# Patient Record
Sex: Male | Born: 1960 | Race: Black or African American | Hispanic: No | Marital: Married | Smoking: Never smoker
Health system: Southern US, Community
[De-identification: ages and names within clinical notes are randomized; demographics above are authoritative.]

## PROBLEM LIST (undated history)

## (undated) DIAGNOSIS — I10 Essential (primary) hypertension: Secondary | ICD-10-CM

## (undated) DIAGNOSIS — E7800 Pure hypercholesterolemia, unspecified: Secondary | ICD-10-CM

## (undated) HISTORY — PX: OTHER SURGICAL HISTORY: SHX169

## (undated) HISTORY — PX: CHOLECYSTECTOMY: SHX55

---

## 2002-01-22 ENCOUNTER — Emergency Department (HOSPITAL_COMMUNITY): Admission: EM | Admit: 2002-01-22 | Discharge: 2002-01-22 | Payer: Self-pay | Admitting: Emergency Medicine

## 2003-12-10 ENCOUNTER — Emergency Department (HOSPITAL_COMMUNITY): Admission: EM | Admit: 2003-12-10 | Discharge: 2003-12-11 | Payer: Self-pay | Admitting: *Deleted

## 2005-04-01 ENCOUNTER — Emergency Department (HOSPITAL_COMMUNITY): Admission: EM | Admit: 2005-04-01 | Discharge: 2005-04-01 | Payer: Self-pay | Admitting: Emergency Medicine

## 2006-02-02 ENCOUNTER — Ambulatory Visit: Payer: Self-pay | Admitting: Family Medicine

## 2006-02-08 ENCOUNTER — Encounter (INDEPENDENT_AMBULATORY_CARE_PROVIDER_SITE_OTHER): Payer: Self-pay | Admitting: Family Medicine

## 2006-02-08 LAB — CONVERTED CEMR LAB: RBC count: 5.17 10*6/uL

## 2006-02-26 ENCOUNTER — Encounter (INDEPENDENT_AMBULATORY_CARE_PROVIDER_SITE_OTHER): Payer: Self-pay | Admitting: Family Medicine

## 2006-02-26 LAB — CONVERTED CEMR LAB: PSA: 0.55 ng/mL

## 2006-03-08 ENCOUNTER — Ambulatory Visit: Payer: Self-pay | Admitting: Family Medicine

## 2006-03-12 ENCOUNTER — Encounter: Payer: Self-pay | Admitting: Family Medicine

## 2006-03-12 DIAGNOSIS — R7989 Other specified abnormal findings of blood chemistry: Secondary | ICD-10-CM | POA: Insufficient documentation

## 2006-03-12 DIAGNOSIS — I10 Essential (primary) hypertension: Secondary | ICD-10-CM | POA: Insufficient documentation

## 2006-03-12 DIAGNOSIS — J309 Allergic rhinitis, unspecified: Secondary | ICD-10-CM | POA: Insufficient documentation

## 2006-03-12 DIAGNOSIS — E785 Hyperlipidemia, unspecified: Secondary | ICD-10-CM

## 2006-03-12 DIAGNOSIS — K219 Gastro-esophageal reflux disease without esophagitis: Secondary | ICD-10-CM

## 2006-06-08 ENCOUNTER — Ambulatory Visit: Payer: Self-pay | Admitting: Family Medicine

## 2006-06-08 LAB — CONVERTED CEMR LAB: HDL goal, serum: 40 mg/dL

## 2006-06-09 ENCOUNTER — Encounter (INDEPENDENT_AMBULATORY_CARE_PROVIDER_SITE_OTHER): Payer: Self-pay | Admitting: Family Medicine

## 2006-07-29 ENCOUNTER — Encounter (INDEPENDENT_AMBULATORY_CARE_PROVIDER_SITE_OTHER): Payer: Self-pay | Admitting: Family Medicine

## 2006-08-04 ENCOUNTER — Ambulatory Visit: Payer: Self-pay | Admitting: Family Medicine

## 2006-09-06 ENCOUNTER — Ambulatory Visit: Payer: Self-pay | Admitting: Family Medicine

## 2006-09-07 LAB — CONVERTED CEMR LAB
ALT: 19 units/L (ref 0–53)
AST: 17 units/L (ref 0–37)
Alkaline Phosphatase: 60 units/L (ref 39–117)
BUN: 15 mg/dL (ref 6–23)
Chloride: 106 meq/L (ref 96–112)
HDL: 40 mg/dL (ref >39)
LDL Cholesterol: 135 mg/dL — ABNORMAL HIGH (ref 0–99)
Total Bilirubin: 0.4 mg/dL (ref 0.3–1.2)
Total CHOL/HDL Ratio: 4.9
Total Protein: 7.2 g/dL (ref 6.0–8.3)
Triglycerides: 106 mg/dL (ref <150)
VLDL: 21 mg/dL (ref 0–40)

## 2006-11-16 ENCOUNTER — Ambulatory Visit: Payer: Self-pay | Admitting: Family Medicine

## 2006-11-16 DIAGNOSIS — J45909 Unspecified asthma, uncomplicated: Secondary | ICD-10-CM | POA: Insufficient documentation

## 2006-12-05 ENCOUNTER — Ambulatory Visit (HOSPITAL_COMMUNITY): Admission: RE | Admit: 2006-12-05 | Discharge: 2006-12-05 | Payer: Self-pay | Admitting: Family Medicine

## 2006-12-06 ENCOUNTER — Telehealth (INDEPENDENT_AMBULATORY_CARE_PROVIDER_SITE_OTHER): Payer: Self-pay | Admitting: *Deleted

## 2006-12-13 ENCOUNTER — Ambulatory Visit: Payer: Self-pay | Admitting: Family Medicine

## 2006-12-13 LAB — CONVERTED CEMR LAB: LDL Goal: 130 mg/dL

## 2007-01-24 ENCOUNTER — Telehealth (INDEPENDENT_AMBULATORY_CARE_PROVIDER_SITE_OTHER): Payer: Self-pay | Admitting: Family Medicine

## 2007-02-15 ENCOUNTER — Encounter (INDEPENDENT_AMBULATORY_CARE_PROVIDER_SITE_OTHER): Payer: Self-pay | Admitting: Family Medicine

## 2007-02-16 ENCOUNTER — Telehealth (INDEPENDENT_AMBULATORY_CARE_PROVIDER_SITE_OTHER): Payer: Self-pay | Admitting: *Deleted

## 2007-02-16 LAB — CONVERTED CEMR LAB
LDL Cholesterol: 119 mg/dL — ABNORMAL HIGH (ref 0–99)
Total CHOL/HDL Ratio: 4.9
Triglycerides: 141 mg/dL (ref <150)
VLDL: 28 mg/dL (ref 0–40)

## 2007-03-14 ENCOUNTER — Ambulatory Visit: Payer: Self-pay | Admitting: Family Medicine

## 2007-06-13 ENCOUNTER — Ambulatory Visit: Payer: Self-pay | Admitting: Family Medicine

## 2007-06-13 DIAGNOSIS — F528 Other sexual dysfunction not due to a substance or known physiological condition: Secondary | ICD-10-CM | POA: Insufficient documentation

## 2007-09-19 ENCOUNTER — Ambulatory Visit: Payer: Self-pay | Admitting: Family Medicine

## 2007-09-20 LAB — CONVERTED CEMR LAB
Chloride: 103 meq/L (ref 96–112)
Creatinine, Ser: 1.1 mg/dL (ref 0.40–1.50)
Glucose, Bld: 110 mg/dL — ABNORMAL HIGH (ref 70–99)
Sodium: 141 meq/L (ref 135–145)

## 2007-10-17 ENCOUNTER — Ambulatory Visit: Payer: Self-pay | Admitting: Family Medicine

## 2007-10-17 LAB — CONVERTED CEMR LAB: Glucose, Urine, Semiquant: NEGATIVE

## 2008-01-23 ENCOUNTER — Ambulatory Visit: Payer: Self-pay | Admitting: Family Medicine

## 2008-02-20 ENCOUNTER — Ambulatory Visit: Payer: Self-pay | Admitting: Family Medicine

## 2008-04-16 ENCOUNTER — Encounter (INDEPENDENT_AMBULATORY_CARE_PROVIDER_SITE_OTHER): Payer: Self-pay | Admitting: Family Medicine

## 2008-04-17 LAB — CONVERTED CEMR LAB
ALT: 19 units/L (ref 0–53)
Albumin: 4.3 g/dL (ref 3.5–5.2)
Alkaline Phosphatase: 57 units/L (ref 39–117)
BUN: 16 mg/dL (ref 6–23)
CO2: 26 meq/L (ref 19–32)
Chloride: 105 meq/L (ref 96–112)
Cholesterol: 176 mg/dL (ref 0–200)
Creatinine, Ser: 1.18 mg/dL (ref 0.40–1.50)
Glucose, Bld: 130 mg/dL — ABNORMAL HIGH (ref 70–99)
LDL Cholesterol: 113 mg/dL — ABNORMAL HIGH (ref 0–99)
Neutro Abs: 2.4 10*3/uL (ref 1.7–7.7)
Potassium: 4.3 meq/L (ref 3.5–5.3)
RBC: 4.95 M/uL (ref 4.22–5.81)
RDW: 13.3 % (ref 11.5–15.5)
Total Bilirubin: 0.5 mg/dL (ref 0.3–1.2)
Total CHOL/HDL Ratio: 4
Total Protein: 7.2 g/dL (ref 6.0–8.3)
Triglycerides: 95 mg/dL (ref <150)

## 2008-05-29 ENCOUNTER — Ambulatory Visit: Payer: Self-pay | Admitting: Family Medicine

## 2008-05-29 DIAGNOSIS — E669 Obesity, unspecified: Secondary | ICD-10-CM

## 2008-05-29 LAB — CONVERTED CEMR LAB
Glucose, Bld: 124 mg/dL
Hgb A1c MFr Bld: 6.1 %

## 2008-07-03 ENCOUNTER — Ambulatory Visit: Payer: Self-pay | Admitting: Family Medicine

## 2008-09-04 ENCOUNTER — Ambulatory Visit: Payer: Self-pay | Admitting: Family Medicine

## 2008-09-05 ENCOUNTER — Encounter (INDEPENDENT_AMBULATORY_CARE_PROVIDER_SITE_OTHER): Payer: Self-pay | Admitting: Family Medicine

## 2008-09-05 LAB — CONVERTED CEMR LAB
BUN: 18 mg/dL (ref 6–23)
CO2: 21 meq/L (ref 19–32)
Calcium: 9.3 mg/dL (ref 8.4–10.5)
Glucose, Bld: 133 mg/dL — ABNORMAL HIGH (ref 70–99)
Potassium: 3.8 meq/L (ref 3.5–5.3)
Sodium: 141 meq/L (ref 135–145)

## 2008-12-04 ENCOUNTER — Ambulatory Visit: Payer: Self-pay | Admitting: Family Medicine

## 2008-12-23 ENCOUNTER — Encounter (INDEPENDENT_AMBULATORY_CARE_PROVIDER_SITE_OTHER): Payer: Self-pay | Admitting: Family Medicine

## 2009-01-22 ENCOUNTER — Emergency Department (HOSPITAL_COMMUNITY): Admission: EM | Admit: 2009-01-22 | Discharge: 2009-01-23 | Payer: Self-pay | Admitting: Emergency Medicine

## 2010-07-02 LAB — URINALYSIS, ROUTINE W REFLEX MICROSCOPIC
Bilirubin Urine: NEGATIVE
Hgb urine dipstick: NEGATIVE
Nitrite: NEGATIVE
Protein, ur: NEGATIVE mg/dL
Urobilinogen, UA: 0.2 mg/dL (ref 0.0–1.0)
pH: 5.5 (ref 5.0–8.0)

## 2010-07-02 LAB — BASIC METABOLIC PANEL
Calcium: 9.4 mg/dL (ref 8.4–10.5)
GFR calc Af Amer: >60 mL/min (ref >60)
Sodium: 136 mEq/L (ref 135–145)

## 2010-07-02 LAB — DIFFERENTIAL
Eosinophils Absolute: 0.1 10*3/uL (ref 0.0–0.7)
Lymphocytes Relative: 14 % (ref 12–46)
Lymphs Abs: 1.3 10*3/uL (ref 0.7–4.0)
Monocytes Absolute: 0.9 10*3/uL (ref 0.1–1.0)

## 2010-07-02 LAB — CBC
Hemoglobin: 14.1 g/dL (ref 13.0–17.0)
MCV: 85.9 fL (ref 78.0–100.0)

## 2011-05-20 ENCOUNTER — Encounter (HOSPITAL_COMMUNITY): Payer: Self-pay | Admitting: *Deleted

## 2011-05-20 ENCOUNTER — Emergency Department (HOSPITAL_COMMUNITY)
Admission: EM | Admit: 2011-05-20 | Discharge: 2011-05-20 | Disposition: A | Discharge status: Home Health | Payer: 59 | Attending: Emergency Medicine | Admitting: Emergency Medicine

## 2011-05-20 ENCOUNTER — Emergency Department (HOSPITAL_COMMUNITY): Payer: 59

## 2011-05-20 DIAGNOSIS — I10 Essential (primary) hypertension: Secondary | ICD-10-CM | POA: Insufficient documentation

## 2011-05-20 DIAGNOSIS — Z7982 Long term (current) use of aspirin: Secondary | ICD-10-CM | POA: Insufficient documentation

## 2011-05-20 DIAGNOSIS — T148XXA Other injury of unspecified body region, initial encounter: Secondary | ICD-10-CM | POA: Insufficient documentation

## 2011-05-20 DIAGNOSIS — E119 Type 2 diabetes mellitus without complications: Secondary | ICD-10-CM | POA: Insufficient documentation

## 2011-05-20 DIAGNOSIS — Z79899 Other long term (current) drug therapy: Secondary | ICD-10-CM | POA: Insufficient documentation

## 2011-05-20 DIAGNOSIS — M545 Low back pain, unspecified: Secondary | ICD-10-CM | POA: Insufficient documentation

## 2011-05-20 DIAGNOSIS — X58XXXA Exposure to other specified factors, initial encounter: Secondary | ICD-10-CM | POA: Insufficient documentation

## 2011-05-20 DIAGNOSIS — M25559 Pain in unspecified hip: Secondary | ICD-10-CM | POA: Insufficient documentation

## 2011-05-20 DIAGNOSIS — E7800 Pure hypercholesterolemia, unspecified: Secondary | ICD-10-CM | POA: Insufficient documentation

## 2011-05-20 DIAGNOSIS — M79609 Pain in unspecified limb: Secondary | ICD-10-CM | POA: Insufficient documentation

## 2011-05-20 HISTORY — DX: Pure hypercholesterolemia, unspecified: E78.00

## 2011-05-20 HISTORY — DX: Essential (primary) hypertension: I10

## 2011-05-20 MED ORDER — CYCLOBENZAPRINE HCL 10 MG PO TABS
10.0000 mg | ORAL_TABLET | Freq: Once | ORAL | Status: AC
  Administered 2011-05-20: 10 mg via ORAL
  Filled 2011-05-20: qty 1

## 2011-05-20 MED ORDER — CYCLOBENZAPRINE HCL 10 MG PO TABS: ORAL_TABLET | ORAL | Status: DC

## 2011-05-20 MED ORDER — IBUPROFEN 800 MG PO TABS
800.0000 mg | ORAL_TABLET | Freq: Once | ORAL | Status: AC
  Administered 2011-05-20: 800 mg via ORAL
  Filled 2011-05-20: qty 1

## 2011-05-20 NOTE — ED Provider Notes (Signed)
Medical screening examination/treatment/procedure(s) were performed by non-physician practitioner and as supervising physician I was immediately available for consultation/collaboration.   Jaysen Wey L Oris Staffieri, MD 05/20/11 2307 

## 2011-05-20 NOTE — ED Notes (Signed)
Pain  Rt lat thigh, for 2 days, no known injury. Says he has to limp.

## 2011-05-20 NOTE — Discharge Instructions (Signed)
Muscle Strain A muscle strain (pulled muscle) happens when a muscle is over-stretched. Recovery usually takes 5 to 6 weeks.  HOME CARE   Put ice on the injured area.   Put ice in a plastic bag.   Place a towel between your skin and the bag.   Leave the ice on for 15 to 20 minutes at a time, every hour for the first 2 days.   Do not use the muscle for several days or until your doctor says you can. Do not use the muscle if you have pain.   Wrap the injured area with an elastic bandage for comfort. Do not put it on too tightly.   Only take medicine as told by your doctor.   Warm up before exercise. This helps prevent muscle strains.  GET HELP RIGHT AWAY IF:  There is increased pain or puffiness (swelling) in the affected area. MAKE SURE YOU:   Understand these instructions.   Will watch your condition.   Will get help right away if you are not doing well or get worse.  Document Released: 12/23/2007 Document Revised: 11/25/2010 Document Reviewed: 12/23/2007 [facility] Patient Information 2012 [place], [place].Heat Therapy Your caregiver advises heat therapy for your condition. Heat applications help reduce pain and muscle spasm around injuries or areas of inflammation. They also increase blood flow to the area which can speed healing. Moist heat is commonly used to help heal skin infections. Heat treatments should be used for about 30-40 minutes every 2-4 hours. Shorter treatments should be used if there is discomfort. Different forms of heat therapy are:  Warm water - Use a basin or tub filled with heated water; change it often to keep the water hot. The water temperature should not be uncomfortable to the skin.   Hot packs - Use several bath towels soaked in hot water and lightly wrung out. These should be changed every 5-10 minutes. You can buy commercially-available packs that provide more sustained heat. Hot water bottles are not recommended because they give only a small amount of  heat.   Electric heating pads - These may be used for dry heat only. Do not use wet material around a regular heating pad because of the risk of electrical shock. Do not leave heating pads on for long periods as they can burn the skin or cause permanent discoloration. Do not lie on top of a heating pad because, again, this can cause a burn.   Heat lamps - Use an infrared light. Keep the bulb 15-25 inches from the skin. Watch for signs of excessive heat (blotchy areas will appear).  Be cautious with heat therapy to avoid burning the skin. You should not use heat therapy without careful medical supervision if you have: circulation problems, numbness or unusual swelling in the area to be treated. Document Released: 03/15/2005 Document Revised: 11/25/2010 Document Reviewed: 09/10/2006 [facility] Patient Information 2012 [place], [place].    Take the flexeril as directed.  Take ibuprofen up to 800 mg every 8 hrs with food.  Apply warm compresses 10-15 min several times daily to area of soreness.  Follow up with your MD as needed.

## 2011-05-20 NOTE — ED Provider Notes (Signed)
History     CSN: [id number]  Arrival date & time 05/20/11  1719   First MD Initiated Contact with Patient 05/20/11 1815      Chief Complaint  Patient presents with  . Leg Pain    (Consider location/radiation/quality/duration/timing/severity/associated sxs/prior treatment) Patient is a 51 y.o. male presenting with leg pain. The history is provided by the patient. No language interpreter was used.  Leg Pain  The incident occurred more than 1 week ago (area has become acutely more painful since yest making it difficult to stand  or walk.  does not recall any injury.). There was no injury mechanism. Pain location: R lateral pelvis. The quality of the pain is described as sharp. The pain is at a severity of 7/10. The pain has been intermittent since onset. Associated symptoms include inability to bear weight. Pertinent negatives include no numbness, no loss of motion, no muscle weakness, no loss of sensation and no tingling. He reports no foreign bodies present. The symptoms are aggravated by bearing weight. Treatments tried: APAP. The treatment provided no relief.    Past Medical History  Diagnosis Date  . Diabetes mellitus   . Hypertension   . Hypercholesteremia     Past Surgical History  Procedure Date  . Cholecystectomy     History reviewed. No pertinent family history.  History  Substance Use Topics  . Smoking status: Never Smoker   . Smokeless tobacco: Not on file  . Alcohol Use: No      Review of Systems  Musculoskeletal:       R pelvis pain  Neurological: Negative for tingling and numbness.  All other systems reviewed and are negative.    Allergies  Review of patient's allergies indicates no known allergies.  Home Medications   Current Outpatient Rx  Name Route Sig Dispense Refill  . AMLODIPINE BESYLATE 5 MG PO TABS Oral Take 5 mg by mouth daily.    . ASPIRIN EC 325 MG PO TBEC Oral Take 325 mg by mouth daily.    Marland Kitchen FAMOTIDINE 20 MG PO TABS Oral Take 20 mg  by mouth 2 (two) times daily.    Marland Kitchen LOSARTAN POTASSIUM-HCTZ 100-25 MG PO TABS Oral Take 1 tablet by mouth daily.    Marland Kitchen METFORMIN HCL ER 500 MG PO TB24 Oral Take 500 mg by mouth daily with breakfast.    . SIMVASTATIN 10 MG PO TABS Oral Take 10 mg by mouth at bedtime.      BP 117/77  Pulse 72  Temp(Src) 98.1 F (36.7 C) (Oral)  Resp 20  Ht 5\' 8"  (1.727 m)  Wt 205 lb (92.987 kg)  BMI 31.17 kg/m2  SpO2 100%  Physical Exam  Nursing note and vitals reviewed. Constitutional: He is oriented to person, place, and time. He appears well-developed and well-nourished.  HENT:  Head: Normocephalic and atraumatic.  Eyes: EOM are normal.  Neck: Normal range of motion.  Cardiovascular: Normal rate, regular rhythm, normal heart sounds and intact distal pulses.   Pulmonary/Chest: Effort normal and breath sounds normal. No respiratory distress.  Abdominal: Soft. He exhibits no distension. There is no tenderness.  Musculoskeletal:       Lumbar back: He exhibits decreased range of motion, tenderness and pain. He exhibits no swelling, no deformity, no laceration, no spasm and normal pulse.       Back:  Neurological: He is alert and oriented to person, place, and time.  Skin: Skin is warm and dry.  Psychiatric: He has a normal  mood and affect. Judgment normal.    ED Course  Procedures (including critical care time)  Labs Reviewed - No data to display No results found.   No diagnosis found.  Results for orders placed during the hospital encounter of 01/22/09  URINALYSIS, ROUTINE W REFLEX MICROSCOPIC      Component Value Range   Color, Urine YELLOW  YELLOW    APPearance CLEAR  CLEAR    Specific Gravity, Urine 1.025  1.005 - 1.030    pH 5.5  5.0 - 8.0    Glucose, UA NEGATIVE  NEGATIVE (mg/dL)   Hgb urine dipstick NEGATIVE  NEGATIVE    Bilirubin Urine NEGATIVE  NEGATIVE    Ketones, ur NEGATIVE  NEGATIVE (mg/dL)   Protein, ur NEGATIVE  NEGATIVE (mg/dL)   Urobilinogen, UA 0.2  0.0 - 1.0  (mg/dL)   Nitrite NEGATIVE  NEGATIVE    Leukocytes, UA    NEGATIVE    Value: NEGATIVE MICROSCOPIC NOT DONE ON URINES WITH NEGATIVE PROTEIN, BLOOD, LEUKOCYTES, NITRITE, OR GLUCOSE <1000 mg/dL.  BASIC METABOLIC PANEL      Component Value Range   Sodium 136  135 - 145 (mEq/L)   Potassium 3.9  3.5 - 5.1 (mEq/L)   Chloride 100  96 - 112 (mEq/L)   CO2 28  19 - 32 (mEq/L)   Glucose, Bld 130 (*) 70 - 99 (mg/dL)   BUN 11  6 - 23 (mg/dL)   Creatinine, Ser 4.09  0.4 - 1.5 (mg/dL)   Calcium 9.4  8.4 - 81.1 (mg/dL)   GFR calc non Af Amer >60  >60 (mL/min)   GFR calc Af Amer    >60 (mL/min)   Value: >60            The eGFR has been calculated     using the MDRD equation.     This calculation has not been     validated in all clinical     situations.     eGFR's persistently     <60 mL/min signify     possible Chronic Kidney Disease.  CBC      Component Value Range   WBC 9.9  4.0 - 10.5 (K/uL)   RBC 4.64  4.22 - 5.81 (MIL/uL)   Hemoglobin 14.1  13.0 - 17.0 (g/dL)   HCT 91.4  78.2 - 95.6 (%)   MCV 85.9  78.0 - 100.0 (fL)   MCHC 35.3  30.0 - 36.0 (g/dL)   RDW 21.3  08.6 - 57.8 (%)   Platelets 186  150 - 400 (K/uL)  DIFFERENTIAL      Component Value Range   Neutrophils Relative 76  43 - 77 (%)   Neutro Abs 7.5  1.7 - 7.7 (K/uL)   Lymphocytes Relative 14  12 - 46 (%)   Lymphs Abs 1.3  0.7 - 4.0 (K/uL)   Monocytes Relative 9  3 - 12 (%)   Monocytes Absolute 0.9  0.1 - 1.0 (K/uL)   Eosinophils Relative 1  0 - 5 (%)   Eosinophils Absolute 0.1  0.0 - 0.7 (K/uL)   Basophils Relative 1  0 - 1 (%)   Basophils Absolute 0.1  0.0 - 0.1 (K/uL)   Dg Pelvis 1-2 Views  05/20/2011  *RADIOLOGY REPORT*  Clinical Data: Right hip pain for the past 2 days.  No known injury.  PELVIS - 1-2 VIEW  Comparison: None.  Findings: Left femoral neck synovial pit or cyst.  Otherwise, normal appearing bones and soft  tissues.  IMPRESSION: No acute abnormality.  Original Report Authenticated By: Darrol Angel, M.D.    Results for orders placed during the hospital encounter of 01/22/09  URINALYSIS, ROUTINE W REFLEX MICROSCOPIC      Component Value Range   Color, Urine YELLOW  YELLOW    APPearance CLEAR  CLEAR    Specific Gravity, Urine 1.025  1.005 - 1.030    pH 5.5  5.0 - 8.0    Glucose, UA NEGATIVE  NEGATIVE (mg/dL)   Hgb urine dipstick NEGATIVE  NEGATIVE    Bilirubin Urine NEGATIVE  NEGATIVE    Ketones, ur NEGATIVE  NEGATIVE (mg/dL)   Protein, ur NEGATIVE  NEGATIVE (mg/dL)   Urobilinogen, UA 0.2  0.0 - 1.0 (mg/dL)   Nitrite NEGATIVE  NEGATIVE    Leukocytes, UA    NEGATIVE    Value: NEGATIVE MICROSCOPIC NOT DONE ON URINES WITH NEGATIVE PROTEIN, BLOOD, LEUKOCYTES, NITRITE, OR GLUCOSE <1000 mg/dL.  BASIC METABOLIC PANEL      Component Value Range   Sodium 136  135 - 145 (mEq/L)   Potassium 3.9  3.5 - 5.1 (mEq/L)   Chloride 100  96 - 112 (mEq/L)   CO2 28  19 - 32 (mEq/L)   Glucose, Bld 130 (*) 70 - 99 (mg/dL)   BUN 11  6 - 23 (mg/dL)   Creatinine, Ser 1.61  0.4 - 1.5 (mg/dL)   Calcium 9.4  8.4 - 09.6 (mg/dL)   GFR calc non Af Amer >60  >60 (mL/min)   GFR calc Af Amer    >60 (mL/min)   Value: >60            The eGFR has been calculated     using the MDRD equation.     This calculation has not been     validated in all clinical     situations.     eGFR's persistently     <60 mL/min signify     possible Chronic Kidney Disease.  CBC      Component Value Range   WBC 9.9  4.0 - 10.5 (K/uL)   RBC 4.64  4.22 - 5.81 (MIL/uL)   Hemoglobin 14.1  13.0 - 17.0 (g/dL)   HCT 04.5  40.9 - 81.1 (%)   MCV 85.9  78.0 - 100.0 (fL)   MCHC 35.3  30.0 - 36.0 (g/dL)   RDW 91.4  78.2 - 95.6 (%)   Platelets 186  150 - 400 (K/uL)  DIFFERENTIAL      Component Value Range   Neutrophils Relative 76  43 - 77 (%)   Neutro Abs 7.5  1.7 - 7.7 (K/uL)   Lymphocytes Relative 14  12 - 46 (%)   Lymphs Abs 1.3  0.7 - 4.0 (K/uL)   Monocytes Relative 9  3 - 12 (%)   Monocytes Absolute 0.9  0.1 - 1.0 (K/uL)    Eosinophils Relative 1  0 - 5 (%)   Eosinophils Absolute 0.1  0.0 - 0.7 (K/uL)   Basophils Relative 1  0 - 1 (%)   Basophils Absolute 0.1  0.0 - 0.1 (K/uL)   Dg Pelvis 1-2 Views  05/20/2011  *RADIOLOGY REPORT*  Clinical Data: Right hip pain for the past 2 days.  No known injury.  PELVIS - 1-2 VIEW  Comparison: None.  Findings: Left femoral neck synovial pit or cyst.  Otherwise, normal appearing bones and soft tissues.  IMPRESSION: No acute abnormality.  Original Report Authenticated By: Darrol Angel, M.D.  MDM          Worthy Rancher, PA 05/20/11 1926  Worthy Rancher, PA 05/20/11 [id number]

## 2012-03-10 ENCOUNTER — Observation Stay (HOSPITAL_COMMUNITY)
Admission: EM | Admit: 2012-03-10 | Discharge: 2012-03-12 | Disposition: A | Discharge status: Home / Self Care | Payer: 59 | Attending: Internal Medicine | Admitting: Internal Medicine

## 2012-03-10 ENCOUNTER — Encounter (HOSPITAL_COMMUNITY): Payer: Self-pay | Admitting: *Deleted

## 2012-03-10 DIAGNOSIS — J45909 Unspecified asthma, uncomplicated: Secondary | ICD-10-CM | POA: Diagnosis present

## 2012-03-10 DIAGNOSIS — R5381 Other malaise: Secondary | ICD-10-CM | POA: Insufficient documentation

## 2012-03-10 DIAGNOSIS — R7989 Other specified abnormal findings of blood chemistry: Secondary | ICD-10-CM

## 2012-03-10 DIAGNOSIS — K633 Ulcer of intestine: Secondary | ICD-10-CM

## 2012-03-10 DIAGNOSIS — Z7902 Long term (current) use of antithrombotics/antiplatelets: Secondary | ICD-10-CM | POA: Insufficient documentation

## 2012-03-10 DIAGNOSIS — I10 Essential (primary) hypertension: Secondary | ICD-10-CM | POA: Diagnosis present

## 2012-03-10 DIAGNOSIS — Y92009 Unspecified place in unspecified non-institutional (private) residence as the place of occurrence of the external cause: Secondary | ICD-10-CM | POA: Insufficient documentation

## 2012-03-10 DIAGNOSIS — Y849 Medical procedure, unspecified as the cause of abnormal reaction of the patient, or of later complication, without mention of misadventure at the time of the procedure: Secondary | ICD-10-CM | POA: Insufficient documentation

## 2012-03-10 DIAGNOSIS — Z79899 Other long term (current) drug therapy: Secondary | ICD-10-CM | POA: Insufficient documentation

## 2012-03-10 DIAGNOSIS — Z9889 Other specified postprocedural states: Secondary | ICD-10-CM

## 2012-03-10 DIAGNOSIS — Z860100 Personal history of colon polyps, unspecified: Secondary | ICD-10-CM

## 2012-03-10 DIAGNOSIS — E785 Hyperlipidemia, unspecified: Secondary | ICD-10-CM | POA: Insufficient documentation

## 2012-03-10 DIAGNOSIS — IMO0002 Reserved for concepts with insufficient information to code with codable children: Principal | ICD-10-CM | POA: Insufficient documentation

## 2012-03-10 DIAGNOSIS — E119 Type 2 diabetes mellitus without complications: Secondary | ICD-10-CM | POA: Insufficient documentation

## 2012-03-10 DIAGNOSIS — E669 Obesity, unspecified: Secondary | ICD-10-CM | POA: Diagnosis present

## 2012-03-10 DIAGNOSIS — K625 Hemorrhage of anus and rectum: Secondary | ICD-10-CM

## 2012-03-10 DIAGNOSIS — D62 Acute posthemorrhagic anemia: Secondary | ICD-10-CM | POA: Insufficient documentation

## 2012-03-10 LAB — CBC WITH DIFFERENTIAL/PLATELET
Basophils Absolute: 0 10*3/uL (ref 0.0–0.1)
Eosinophils Absolute: 0.1 10*3/uL (ref 0.0–0.7)
Lymphocytes Relative: 41 % (ref 12–46)
Lymphs Abs: 2.7 10*3/uL (ref 0.7–4.0)
MCH: 28.4 pg (ref 26.0–34.0)
MCV: 82 fL (ref 78.0–100.0)
Monocytes Absolute: 0.6 10*3/uL (ref 0.1–1.0)
Monocytes Relative: 9 % (ref 3–12)
Neutro Abs: 3.2 10*3/uL (ref 1.7–7.7)
Neutrophils Relative %: 48 % (ref 43–77)
Platelets: 216 10*3/uL (ref 150–400)
RDW: 13.6 % (ref 11.5–15.5)

## 2012-03-10 LAB — BASIC METABOLIC PANEL
BUN: 21 mg/dL (ref 6–23)
CO2: 28 mEq/L (ref 19–32)
Creatinine, Ser: 1.15 mg/dL (ref 0.50–1.35)
GFR calc non Af Amer: 73 mL/min — ABNORMAL LOW (ref >90)
Sodium: 139 mEq/L (ref 135–145)

## 2012-03-10 LAB — GLUCOSE, CAPILLARY: Glucose-Capillary: 106 mg/dL — ABNORMAL HIGH (ref 70–99)

## 2012-03-10 LAB — TYPE AND SCREEN
ABO/RH(D): B POS
Antibody Screen: NEGATIVE

## 2012-03-10 MED ORDER — HYDROCHLOROTHIAZIDE 25 MG PO TABS
25.0000 mg | ORAL_TABLET | Freq: Every day | ORAL | Status: DC
  Administered 2012-03-10 – 2012-03-12 (×3): 25 mg via ORAL
  Filled 2012-03-10 (×3): qty 1

## 2012-03-10 MED ORDER — ONDANSETRON HCL 4 MG PO TABS: 4.0000 mg | ORAL_TABLET | Freq: Four times a day (QID) | ORAL | Status: DC | PRN

## 2012-03-10 MED ORDER — FAMOTIDINE 20 MG PO TABS
20.0000 mg | ORAL_TABLET | Freq: Every day | ORAL | Status: DC
  Administered 2012-03-10 – 2012-03-12 (×3): 20 mg via ORAL
  Filled 2012-03-10 (×3): qty 1

## 2012-03-10 MED ORDER — SODIUM CHLORIDE 0.9 % IV BOLUS (SEPSIS)
1000.0000 mL | Freq: Once | INTRAVENOUS | Status: AC
  Administered 2012-03-10: 1000 mL via INTRAVENOUS

## 2012-03-10 MED ORDER — PEG 3350-KCL-NA BICARB-NACL 420 G PO SOLR
4000.0000 mL | Freq: Once | ORAL | Status: AC
  Administered 2012-03-10: 4000 mL via ORAL
  Filled 2012-03-10: qty 4000

## 2012-03-10 MED ORDER — ZOLPIDEM TARTRATE 5 MG PO TABS: 5.0000 mg | ORAL_TABLET | Freq: Every evening | ORAL | Status: DC | PRN

## 2012-03-10 MED ORDER — LOSARTAN POTASSIUM-HCTZ 100-25 MG PO TABS: 1.0000 | ORAL_TABLET | Freq: Every day | ORAL | Status: DC

## 2012-03-10 MED ORDER — SODIUM CHLORIDE 0.9 % IV SOLN
INTRAVENOUS | Status: AC
  Administered 2012-03-10: 75 mL/h via INTRAVENOUS

## 2012-03-10 MED ORDER — METFORMIN HCL ER 500 MG PO TB24
500.0000 mg | ORAL_TABLET | Freq: Every day | ORAL | Status: DC
  Administered 2012-03-11 – 2012-03-12 (×2): 500 mg via ORAL
  Filled 2012-03-10 (×4): qty 1

## 2012-03-10 MED ORDER — DEXTROSE-NACL 5-0.9 % IV SOLN
INTRAVENOUS | Status: DC
  Administered 2012-03-10 – 2012-03-12 (×5): via INTRAVENOUS

## 2012-03-10 MED ORDER — SODIUM CHLORIDE 0.9 % IV SOLN: INTRAVENOUS | Status: DC

## 2012-03-10 MED ORDER — ONDANSETRON HCL 4 MG/2ML IJ SOLN: 4.0000 mg | Freq: Four times a day (QID) | INTRAMUSCULAR | Status: DC | PRN

## 2012-03-10 MED ORDER — INSULIN ASPART 100 UNIT/ML ~~LOC~~ SOLN
0.0000 [IU] | SUBCUTANEOUS | Status: DC
  Administered 2012-03-11 (×2): 1 [IU] via SUBCUTANEOUS

## 2012-03-10 MED ORDER — SIMVASTATIN 10 MG PO TABS
10.0000 mg | ORAL_TABLET | Freq: Every day | ORAL | Status: DC
  Administered 2012-03-11: 10 mg via ORAL
  Filled 2012-03-10 (×2): qty 1

## 2012-03-10 MED ORDER — LOSARTAN POTASSIUM 50 MG PO TABS
100.0000 mg | ORAL_TABLET | Freq: Every day | ORAL | Status: DC
  Administered 2012-03-10 – 2012-03-12 (×3): 100 mg via ORAL
  Filled 2012-03-10 (×3): qty 2

## 2012-03-10 MED ORDER — AMLODIPINE BESYLATE 5 MG PO TABS
5.0000 mg | ORAL_TABLET | Freq: Every day | ORAL | Status: DC
Start: 2012-03-10 — End: 2012-03-12
  Administered 2012-03-10 – 2012-03-12 (×3): 5 mg via ORAL
  Filled 2012-03-10 (×3): qty 1

## 2012-03-10 NOTE — Progress Notes (Signed)
Was called to admit patient Kevin Sampson, Kevin Sampson 51 y/o who had polypectomy done ~ 8 days ago by Dr. Elnoria Howard.  Per discussion with ED physician Dr. Rosalia Hammers case was discussed with Dr. Elnoria Howard who requested patient be transferred to [facility] and his associate Dr. Yancey Flemings would see in consult.  It has been requested that we further evaluate patient for admission given his current condition.  Will place care order instruction for nursing to page flow manager once patient reaches [place] floor.  Reportedly per discussion with ED physician and documented vital signs patient is currently stable.  Zacari Radick, [employer]

## 2012-03-10 NOTE — Consult Note (Signed)
This is a patient of mine who started to have bleeding starting yesterday.  In total he reports 5 bloody bowel movements.  He is s/p ascending colon polypectomy for an 8 mm polyp with a hot snare.  The patient felt a little dizzy today.  I spoke with Dr. Marina Goodell who is on call for me about the patient.  He is currently scheduled to have a colonoscopy tomorrow for a post polypectomy bleed.  The patient will be admitted to the Hospitalist Service for his history of DM and HTN.

## 2012-03-10 NOTE — ED Provider Notes (Signed)
History   This chart was scribed for Hilario Quarry, MD, by Frederik Pear, ER scribe. The patient was seen in room APA04/APA04 and the patient's care was started at 1400.    CSN: [id number]  Arrival date & time 03/10/12  1150   First MD Initiated Contact with Patient 03/10/12 1400      Chief Complaint  Patient presents with  . Rectal Bleeding    (Consider location/radiation/quality/duration/timing/severity/associated sxs/prior treatment) HPI Comments: Kevin Sampson is a 51 y.o. male who presents to the Emergency Department complaining of rectal bleeding 8x that began yesterday. He states that it is painless and consists of dark red blood mixed with stool. He reports that he had a standard colonoscopy because of his age 65 days ago by Dr. Elnoria Howard in [place] and discontinued his aspiring 7 days prior to the procedure, but started back the day after. He denies any associated lightheadedness. He has a h/o of DM and high cholesterol. He takes apsirin, metformin, Hyzaar, Norvasc, Pepsid, and Zocor daily.  PCP is Dr. Loleta Chance.    Past Medical History  Diagnosis Date  . Diabetes mellitus   . Hypertension   . Hypercholesteremia     Past Surgical History  Procedure Date  . Cholecystectomy   . Colonosopy     History reviewed. No pertinent family history.  History  Substance Use Topics  . Smoking status: Never Smoker   . Smokeless tobacco: Not on file  . Alcohol Use: No      Review of Systems  Constitutional: Negative for fever and chills.  HENT: Negative for neck stiffness.   Eyes: Negative for visual disturbance.  Respiratory: Negative for shortness of breath.   Cardiovascular: Negative for chest pain.  Gastrointestinal: Positive for anal bleeding. Negative for vomiting, diarrhea and blood in stool.  Genitourinary: Negative for dysuria, frequency and decreased urine volume.  Musculoskeletal: Negative for myalgias and joint swelling.  Skin: Negative for rash.  Neurological:  Positive for weakness.  Hematological: Negative for adenopathy.  Psychiatric/Behavioral: Negative for agitation.  All other systems reviewed and are negative.    Allergies  Review of patient's allergies indicates no known allergies.  Home Medications   Current Outpatient Rx  Name  Route  Sig  Dispense  Refill  . AMLODIPINE BESYLATE 5 MG PO TABS   Oral   Take 5 mg by mouth daily.         . ASPIRIN EC 325 MG PO TBEC   Oral   Take 325 mg by mouth daily.         . CYCLOBENZAPRINE HCL 10 MG PO TABS      Take 1/2 to one tab po TID for muscle spasm   20 tablet   0   . FAMOTIDINE 20 MG PO TABS   Oral   Take 20 mg by mouth 2 (two) times daily.         Marland Kitchen LOSARTAN POTASSIUM-HCTZ 100-25 MG PO TABS   Oral   Take 1 tablet by mouth daily.         Marland Kitchen METFORMIN HCL ER 500 MG PO TB24   Oral   Take 500 mg by mouth daily with breakfast.         . SIMVASTATIN 10 MG PO TABS   Oral   Take 10 mg by mouth at bedtime.           BP 97/74  Pulse 92  Temp 98.2 F (36.8 C) (Oral)  Resp 18  Ht  5\' 8"  (1.727 m)  Wt 206 lb (93.441 kg)  BMI 31.32 kg/m2  SpO2 100%  Physical Exam  Nursing note and vitals reviewed. Constitutional: He appears well-developed and well-nourished.  HENT:  Head: Normocephalic and atraumatic.  Eyes: Conjunctivae normal and EOM are normal. Pupils are equal, round, and reactive to light.  Neck: Normal range of motion. Neck supple.  Pulmonary/Chest: Effort normal and breath sounds normal.  Abdominal: Soft. Bowel sounds are normal.  Genitourinary:       He has grossly bloody stool.  Musculoskeletal: Normal range of motion.  Neurological: He is alert.  Skin: Skin is warm and dry.  Psychiatric: He has a normal mood and affect. Thought content normal.    ED Course  Procedures (including critical care time)  DIAGNOSTIC STUDIES: Oxygen Saturation is 100% on room air, normal by my interpretation.    COORDINATION OF CARE:  14:15- Discussed planned  course of treatment with the patient, including blood work, who is agreeable at this time.   Results for orders placed during the hospital encounter of 03/10/12  CBC WITH DIFFERENTIAL      Component Value Range   WBC 6.7  4.0 - 10.5 K/uL   RBC 4.22  4.22 - 5.81 MIL/uL   Hemoglobin 12.0 (*) 13.0 - 17.0 g/dL   HCT 16.1 (*) 09.6 - 04.5 %   MCV 82.0  78.0 - 100.0 fL   MCH 28.4  26.0 - 34.0 pg   MCHC 34.7  30.0 - 36.0 g/dL   RDW 40.9  81.1 - 91.4 %   Platelets 216  150 - 400 K/uL   Neutrophils Relative 48  43 - 77 %   Neutro Abs 3.2  1.7 - 7.7 K/uL   Lymphocytes Relative 41  12 - 46 %   Lymphs Abs 2.7  0.7 - 4.0 K/uL   Monocytes Relative 9  3 - 12 %   Monocytes Absolute 0.6  0.1 - 1.0 K/uL   Eosinophils Relative 2  0 - 5 %   Eosinophils Absolute 0.1  0.0 - 0.7 K/uL   Basophils Relative 0  0 - 1 %   Basophils Absolute 0.0  0.0 - 0.1 K/uL  BASIC METABOLIC PANEL      Component Value Range   Sodium 139  135 - 145 mEq/L   Potassium 3.8  3.5 - 5.1 mEq/L   Chloride 104  96 - 112 mEq/L   CO2 28  19 - 32 mEq/L   Glucose, Bld 158 (*) 70 - 99 mg/dL   BUN 21  6 - 23 mg/dL   Creatinine, Ser 7.82  0.50 - 1.35 mg/dL   Calcium 8.7  8.4 - 95.6 mg/dL   GFR calc non Af Amer 73 (*) >90 mL/min   GFR calc Af Amer 84 (*) >90 mL/min  PROTIME-INR      Component Value Range   Prothrombin Time 13.2  11.6 - 15.2 seconds   INR 1.01  0.00 - 1.49  TYPE AND SCREEN      Component Value Range   ABO/RH(D) B POS     Antibody Screen NEG     Sample Expiration 03/13/2012      Labs Reviewed  CBC WITH DIFFERENTIAL - Abnormal; Notable for the following:    Hemoglobin 12.0 (*)     HCT 34.6 (*)     All other components within normal limits  BASIC METABOLIC PANEL - Abnormal; Notable for the following:    Glucose, Bld 158 (*)  GFR calc non Af Amer 73 (*)     GFR calc Af Amer 84 (*)     All other components within normal limits  PROTIME-INR  TYPE AND SCREEN   No results found.   No diagnosis  found.    MDM   I personally performed the services described in this documentation, which was scribed in my presence. The recorded information has been reviewed and considered.   Discussed with Dr. Elnoria Howard who requests patient be transferred to [facility]  Patient care discussed with Dr. Cena Benton- plan tx to [facility] tele bed.       Hilario Quarry, MD 03/11/12 [id number]

## 2012-03-10 NOTE — ED Notes (Signed)
[facility] called with the bed  Assignment of [id number].  Spoke with [facility] who said they will send a truck.  Nurse informed.

## 2012-03-10 NOTE — ED Notes (Signed)
Report called to Victorino Dike, RN on unit [id number] at [facility].

## 2012-03-10 NOTE — ED Notes (Signed)
Rectal bleeding ,onset yesterday 5 episodes yesterday 2 today, Had colonoscopy done 8 days ago  As a screening exam.  Pt has been taking aspirin.  1 polyp was removed .

## 2012-03-11 ENCOUNTER — Encounter (HOSPITAL_COMMUNITY)
Admission: EM | Disposition: A | Discharge status: Home / Self Care | Payer: Self-pay | Source: Home / Self Care | Attending: Emergency Medicine

## 2012-03-11 ENCOUNTER — Encounter (HOSPITAL_COMMUNITY): Payer: Self-pay | Admitting: Gastroenterology

## 2012-03-11 DIAGNOSIS — Z86010 Personal history of colonic polyps: Secondary | ICD-10-CM

## 2012-03-11 DIAGNOSIS — K625 Hemorrhage of anus and rectum: Secondary | ICD-10-CM

## 2012-03-11 DIAGNOSIS — Z9889 Other specified postprocedural states: Secondary | ICD-10-CM

## 2012-03-11 HISTORY — PX: COLONOSCOPY: SHX5424

## 2012-03-11 LAB — GLUCOSE, CAPILLARY
Glucose-Capillary: 107 mg/dL — ABNORMAL HIGH (ref 70–99)
Glucose-Capillary: 118 mg/dL — ABNORMAL HIGH (ref 70–99)
Glucose-Capillary: 124 mg/dL — ABNORMAL HIGH (ref 70–99)
Glucose-Capillary: 139 mg/dL — ABNORMAL HIGH (ref 70–99)

## 2012-03-11 LAB — HEMOGLOBIN: Hemoglobin: 10.6 g/dL — ABNORMAL LOW (ref 13.0–17.0)

## 2012-03-11 LAB — HEMOGLOBIN AND HEMATOCRIT, BLOOD
HCT: 31.3 % — ABNORMAL LOW (ref 39.0–52.0)
Hemoglobin: 10.8 g/dL — ABNORMAL LOW (ref 13.0–17.0)

## 2012-03-11 LAB — SURGICAL PCR SCREEN: Staphylococcus aureus: POSITIVE — AB

## 2012-03-11 SURGERY — COLONOSCOPY
Anesthesia: Moderate Sedation

## 2012-03-11 MED ORDER — FENTANYL CITRATE 0.05 MG/ML IJ SOLN
INTRAMUSCULAR | Status: DC | PRN
  Administered 2012-03-11 (×3): 25 ug via INTRAVENOUS

## 2012-03-11 MED ORDER — MIDAZOLAM HCL 5 MG/5ML IJ SOLN
INTRAMUSCULAR | Status: DC | PRN
  Administered 2012-03-11 (×3): 2 mg via INTRAVENOUS

## 2012-03-11 MED ORDER — MIDAZOLAM HCL 5 MG/ML IJ SOLN
INTRAMUSCULAR | Status: AC
  Filled 2012-03-11: qty 2

## 2012-03-11 MED ORDER — DIPHENHYDRAMINE HCL 50 MG/ML IJ SOLN
INTRAMUSCULAR | Status: AC
  Filled 2012-03-11: qty 1

## 2012-03-11 MED ORDER — SODIUM CHLORIDE 0.9 % IV SOLN
INTRAVENOUS | Status: DC
  Administered 2012-03-11: 500 mL via INTRAVENOUS

## 2012-03-11 MED ORDER — FENTANYL CITRATE 0.05 MG/ML IJ SOLN
INTRAMUSCULAR | Status: AC
  Filled 2012-03-11: qty 2

## 2012-03-11 NOTE — Op Note (Signed)
[facility] [address] [place], [address]   COLONOSCOPY PROCEDURE REPORT  PATIENT: Kevin Sampson, Kevin Sampson  MR#: [id number] BIRTHDATE: [birth year] , 51  yrs. old GENDER: Male ENDOSCOPIST: Roxy Cedar, MD REFERRED [id number] Elnoria Howard, M.D. PROCEDURE DATE:  03/11/2012 PROCEDURE:   Colonoscopy with control of bleeding ASA CLASS:   Class II INDICATIONS:rectal bleeding.   Post polypectomy 9 days ago w/ Dr Elnoria Howard MEDICATIONS: Fentanyl 75 mcg IV and Versed 6 mg IV  DESCRIPTION OF PROCEDURE:   After the risks benefits and alternatives of the procedure were thoroughly explained, informed consent was obtained.  A digital rectal exam revealed no abnormalities of the rectum.   The pentax [id number]  endoscope was introduced through the anus and advanced to the cecum, which was identified by the ileocecal valve. No adverse events experienced. The quality of the prep was good, using MoviPrep  The instrument was then slowly withdrawn as the colon was fully examined.      COLON FINDINGS: No blood in the colon.  A 1.5 cm nonbleeding ulcer with red spot was found in the ascending colon. This wastreated with Endo Clip.  The colon was otherwise normal.  There was no diverticulosis, inflammation, polyps or cancers.  Retroflexed views revealed no abnormalities. The time to cecum=3 minutes 0 seconds. Withdrawal time=10 minutes 0 seconds.  The scope was withdrawn and the procedure completed. COMPLICATIONS: There were no complications.  ENDOSCOPIC IMPRESSION: 1.   Nonbleeding Ulcer in the ascending colon - s/p Enod Clip therapy 2.   The colon was otherwise normal  RECOMMENDATIONS: 1. Return to hospital, clears, monitor for rebleed   eSigned:  Roxy Cedar, MD 03/11/2012 3:04 PM   cc: Jeani Hawking, MD and The Patient

## 2012-03-11 NOTE — Progress Notes (Signed)
Patient ID: Kevin Sampson, male   DOB: [birth year], 51 y.o.   MRN: [id number] [facility] Progress Note  Subjective: Up all night drinking prep- still passing red blood- feels OK- denies abdominal pain,some nausea with prep. Denies dizziness. HGB 10.6  Down from 12 last pm. Pt had gone back on 325 mg ASA  Post procedure  Objective:  Vital signs in last 24 hours: Temp:  [98.1 F (36.7 C)-98.4 F (36.9 C)] 98.1 F (36.7 C) (12/14 0518) Pulse Rate:  [77-102] 94  (12/14 0518) Resp:  [16-18] 18  (12/14 0518) BP: (97-131)/(39-92) 106/62 mmHg (12/14 0518) SpO2:  [98 %-100 %] 100 % (12/14 0518) Weight:  [202 lb 8 oz (91.853 kg)-206 lb (93.441 kg)] 202 lb 8 oz (91.853 kg) (12/13 1900)   General:   Alert,  Well-developed, AA male   in NAD Heart:  Regular rate and rhythm; no murmurs Pulm;clear Abdomen:  Soft, nontender and nondistended. Normal bowel sounds, without guarding, and without rebound.   Extremities:  Without edema. Neurologic:  Alert and  oriented x4;  grossly normal neurologically. Psych:  Alert and cooperative. Normal mood and affect.  Intake/Output from previous day:   Intake/Output this shift: Total I/O In: 715 [I.V.:715] Out: -   Lab Results:  Basename 03/11/12 0503 03/10/12 1220  WBC -- 6.7  HGB 10.6* 12.0*  HCT 31.0* 34.6*  PLT -- 216   BMET  Basename 03/10/12 1220  NA 139  K 3.8  CL 104  CO2 28  GLUCOSE 158*  BUN 21  CREATININE 1.15  CALCIUM 8.7   LFT No results found for this basename: PROT,ALBUMIN,AST,ALT,ALKPHOS,BILITOT,BILIDIR,IBILI in the last 72 hours PT/INR  Basename 03/10/12 1220  LABPROT 13.2  INR 1.01      Assessment / Plan: #1  51 yo male s/p polypectomy 9 days ago with 8 mm right colon polyp hot snared now with acute post polypectomy bleed. Hemodynamically stable ,but has not cleared with prep. Will proceed with colonoscopy today #2 anemia- secondary to blood loss- keep hgb 8-9 range  Stop aspirin for at  LEAST 2  WEEKS. Active Problems:  OBESITY  HYPERTENSION  ASTHMA  HYPERGLYCEMIA  Rectal bleeding  H/O colonoscopy with polypectomy     LOS: 1 day   Amy Esterwood  03/11/2012, 9:34 AM    GI Attending  Seen and examined. Agree with above. For colonoscopy.The nature of the procedure, as well as the risks, benefits, and alternatives were carefully and thoroughly reviewed with the patient. Ample time for discussion and questions allowed. The patient understood, was satisfied, and agreed to proceed.   Wilhemina Bonito. Eda Keys., M.D. [facility] Division of Gastroenterology

## 2012-03-11 NOTE — Progress Notes (Signed)
[facility] PROGRESS NOTE  Kevin Sampson ZOX:[id number] DOB: 1960-06-09 DOA: 03/10/2012 PCP: No primary provider on file.  HPI/Subjective: Multiple bowel movements secondary to colonoscopy preparation. Still tinged with blood   Assessment/Plan:  Bright red blood per rectum -Recent colonoscopy and polypectomy done by Dr. Elnoria Howard. -Hemoglobin dropped to 10.6 from 12.0. -Continue to check hemoglobin transfuse if less than 8.0. -Colonoscopy today to be done by Dr. Marina Goodell to control the bleeding.  Anemia -Acute blood loss anemia, baseline hemoglobin is 12.0. -Transfuse if hemoglobin less than 8.0  Hypertension -Controled  Code Status: Full code Family Communication: Family at bedside) Disposition Plan: Home once okay with GI may be later today or tomorrow morning.   Consultants:  Marina Goodell of [facility]  Procedures:  Colonoscopy scheduled today  Antibiotics:   non-   Objective: Filed Vitals:   03/10/12 1700 03/10/12 1811 03/10/12 1900 03/11/12 0518  BP: 127/75 116/39 131/92 106/62  Pulse:  82 93 94  Temp:   98.4 F (36.9 C) 98.1 F (36.7 C)  TempSrc:   Oral Oral  Resp:  18 16 18   Height:   5\' 7"  (1.702 m)   Weight:   91.853 kg (202 lb 8 oz)   SpO2:  99% 100% 100%    Intake/Output Summary (Last 24 hours) at 03/11/12 0923 Last data filed at 03/11/12 0800  Gross per 24 hour  Intake    715 ml  Output      0 ml  Net    715 ml   Filed Weights   03/10/12 1202 03/10/12 1900  Weight: 93.441 kg (206 lb) 91.853 kg (202 lb 8 oz)    Exam:  General: Alert and awake, oriented x3, not in any acute distress. HEENT: anicteric sclera, pupils reactive to light and accommodation, EOMI CVS: S1-S2 clear, no murmur rubs or gallops Chest: clear to auscultation bilaterally, no wheezing, rales or rhonchi Abdomen: soft nontender, nondistended, normal bowel sounds, no organomegaly Extremities: no cyanosis, clubbing or edema noted bilaterally Neuro: Cranial nerves II-XII  intact, no focal neurological deficits  Data Reviewed: Basic Metabolic Panel:  Lab 03/10/12 8119  NA 139  K 3.8  CL 104  CO2 28  GLUCOSE 158*  BUN 21  CREATININE 1.15  CALCIUM 8.7  MG --  PHOS --   Liver Function Tests: No results found for this basename: AST:5,ALT:5,ALKPHOS:5,BILITOT:5,PROT:5,ALBUMIN:5 in the last 168 hours No results found for this basename: LIPASE:5,AMYLASE:5 in the last 168 hours No results found for this basename: AMMONIA:5 in the last 168 hours CBC:  Lab 03/11/12 0503 03/10/12 1220  WBC -- 6.7  NEUTROABS -- 3.2  HGB 10.6* 12.0*  HCT 31.0* 34.6*  MCV -- 82.0  PLT -- 216   Cardiac Enzymes: No results found for this basename: CKTOTAL:5,CKMB:5,CKMBINDEX:5,TROPONINI:5 in the last 168 hours BNP (last 3 results) No results found for this basename: PROBNP:3 in the last 8760 hours CBG:  Lab 03/11/12 0747 03/11/12 0409 03/11/12 0008 03/10/12 2229  GLUCAP 124* 118* 107* 106*    Recent Results (from the past 240 hour(s))  SURGICAL PCR SCREEN     Status: Abnormal   Collection Time   03/11/12  5:57 AM      Component Value Range Status Comment   MRSA, PCR NEGATIVE  NEGATIVE Final    Staphylococcus aureus POSITIVE (*) NEGATIVE Final      Studies: No results found.  Scheduled Meds:   . amLODipine  5 mg Oral Daily  . famotidine  20 mg Oral Daily  .  losartan  100 mg Oral Daily   And  . hydrochlorothiazide  25 mg Oral Daily  . insulin aspart  0-9 Units Subcutaneous Q4H  . metFORMIN  500 mg Oral Q breakfast  . simvastatin  10 mg Oral q1800   Continuous Infusions:   . dextrose 5 % and 0.9% NaCl 75 mL/hr at 03/11/12 4098    Active Problems:  OBESITY  HYPERTENSION  ASTHMA  HYPERGLYCEMIA  Rectal bleeding  H/O colonoscopy with polypectomy    Time spent: 25 minutes    [facility] A  [facility] Pager [phone]. If 8PM-8AM, please contact night-coverage at [email or web address], password [facility] 03/11/2012, 9:23 AM  LOS: 1 day

## 2012-03-11 NOTE — H&P (Signed)
[facility] History and Physical  Kevin Sampson JWJ:[id number] DOB: Mar 30, 1960    PCP:   No primary provider on file.   Chief Complaint: painless rectal bleeding after polypectomy 8 days ago.  HPI: TALLIE HEVIA is an 51 y.o. male with hx of DM on metformin, HTN, hyperlipidemia, presents to [facility] for several episode of painless rectal bleeding.  He had a screening colonoscopy by Dr Sherri Rad 8 days ago, and at that time, had a small polyp removed.  He denied CP, lightheadedness, N/V, black stool, or abdominal cramps or pain.  He has had no fever or chills.  Evaluation in [facility] showed stable hemodynamics, Hb of 12 g/DL.  EDP spoke with GI and was recommended that he be admitted for follow up colonoscopy for bleeding after polypectomy.    Rewiew of Systems:  Constitutional: Negative for malaise, fever and chills. No significant weight loss or weight gain Eyes: Negative for eye pain, redness and discharge, diplopia, visual changes, or flashes of light. ENMT: Negative for ear pain, hoarseness, nasal congestion, sinus pressure and sore throat. No headaches; tinnitus, drooling, or problem swallowing. Cardiovascular: Negative for chest pain, palpitations, diaphoresis, dyspnea and peripheral edema. ; No orthopnea, PND Respiratory: Negative for cough, hemoptysis, wheezing and stridor. No pleuritic chestpain. Gastrointestinal: Negative for nausea, vomiting, diarrhea, constipation, abdominal pain, melena,  hematemesis, jaundice   Genitourinary: Negative for frequency, dysuria, incontinence,flank pain and hematuria; Musculoskeletal: Negative for back pain and neck pain. Negative for swelling and trauma.;  Skin: . Negative for pruritus, rash, abrasions, bruising and skin lesion.; ulcerations Neuro: Negative for headache, lightheadedness and neck stiffness. Negative for weakness, altered level of consciousness , altered mental status, extremity weakness, burning feet, involuntary movement, seizure and syncope.   Psych: negative for anxiety, depression, insomnia, tearfulness, panic attacks, hallucinations, paranoia, suicidal or homicidal ideation   Past Medical History  Diagnosis Date  . Diabetes mellitus   . Hypertension   . Hypercholesteremia     Past Surgical History  Procedure Date  . Cholecystectomy   . Colonosopy     Medications:  HOME MEDS: Prior to Admission medications   Medication Sig Start Date End Date Taking? Authorizing Provider  amLODipine (NORVASC) 5 MG tablet Take 5 mg by mouth daily.   Yes Historical Provider, MD  aspirin EC 325 MG tablet Take 325 mg by mouth daily.   Yes Historical Provider, MD  losartan-hydrochlorothiazide (HYZAAR) 100-25 MG per tablet Take 1 tablet by mouth daily.   Yes Historical Provider, MD  metFORMIN (GLUCOPHAGE-XR) 500 MG 24 hr tablet Take 500 mg by mouth daily with breakfast.   Yes Historical Provider, MD  simvastatin (ZOCOR) 10 MG tablet Take 10 mg by mouth at bedtime.   Yes Historical Provider, MD  famotidine (PEPCID) 20 MG tablet Take 20 mg by mouth daily. Takes every once in a while    Historical Provider, MD     Allergies:  No Known Allergies  Social History:   reports that he has never smoked. He does not have any smokeless tobacco history on file. He reports that he does not drink alcohol or use illicit drugs.  Family History: History reviewed. No pertinent family history.   Physical Exam: Filed Vitals:   03/10/12 1621 03/10/12 1700 03/10/12 1811 03/10/12 1900  BP:  127/75 116/39 131/92  Pulse: 77  82 93  Temp: 98.4 F (36.9 C)   98.4 F (36.9 C)  TempSrc: Oral   Oral  Resp:   18 16  Height:  5\' 7"  (1.702 m)  Weight:    91.853 kg (202 lb 8 oz)  SpO2: 98%  99% 100%   Blood pressure 131/92, pulse 93, temperature 98.4 F (36.9 C), temperature source Oral, resp. rate 16, height 5\' 7"  (1.702 m), weight 91.853 kg (202 lb 8 oz), SpO2 100.00%.  GEN:  Pleasant  patient lying in the stretcher in no acute distress;  cooperative with exam. PSYCH:  alert and oriented x4; does not appear anxious or depressed; affect is appropriate. HEENT: Mucous membranes pink and anicteric; PERRLA; EOM intact; no cervical lymphadenopathy nor thyromegaly or carotid bruit; no JVD; There were no stridor. Neck is very supple. Breasts:: Not examined CHEST WALL: No tenderness CHEST: Normal respiration, clear to auscultation bilaterally.  HEART: Regular rate and rhythm.  There are no murmur, rub, or gallops.   BACK: No kyphosis or scoliosis; no CVA tenderness ABDOMEN: soft and non-tender; no masses, no organomegaly, normal abdominal bowel sounds; no pannus; no intertriginous candida. There is no rebound and no distention. Rectal Exam: Not done EXTREMITIES: No bone or joint deformity; age-appropriate arthropathy of the hands and knees; no edema; no ulcerations.  There is no calf tenderness. Genitalia: not examined PULSES: 2+ and symmetric SKIN: Normal hydration no rash or ulceration CNS: Cranial nerves 2-12 grossly intact no focal lateralizing neurologic deficit.  Speech is fluent; uvula elevated with phonation, facial symmetry and tongue midline. DTR are normal bilaterally, cerebella exam is intact, barbinski is negative and strengths are equaled bilaterally.  No sensory loss.   Labs on Admission:  Basic Metabolic Panel:  Lab 03/10/12 1610  NA 139  K 3.8  CL 104  CO2 28  GLUCOSE 158*  BUN 21  CREATININE 1.15  CALCIUM 8.7  MG --  PHOS --   Liver Function Tests: No results found for this basename: AST:5,ALT:5,ALKPHOS:5,BILITOT:5,PROT:5,ALBUMIN:5 in the last 168 hours No results found for this basename: LIPASE:5,AMYLASE:5 in the last 168 hours No results found for this basename: AMMONIA:5 in the last 168 hours CBC:  Lab 03/10/12 1220  WBC 6.7  NEUTROABS 3.2  HGB 12.0*  HCT 34.6*  MCV 82.0  PLT 216   Cardiac Enzymes: No results found for this basename: CKTOTAL:5,CKMB:5,CKMBINDEX:5,TROPONINI:5 in the last 168  hours  CBG:  Lab 03/11/12 0409 03/11/12 0008 03/10/12 2229  GLUCAP 118* 107* 106*     Radiological Exams on Admission: No results found.   Assessment/Plan Present on Admission:  . HYPERTENSION . HYPERGLYCEMIA . OBESITY . ASTHMA  PLAN:  Will admit him and follow H/H.  Type and screen in case he needs transfusion.  He is made NPO as GI has planned to repeat colonoscopy.  For his DM, he said he doesn't like needles, so I will continue with his Metformin, continue NPO, and give D5 IV.  His other problems are stable.  He will be admitted to [facility] service.  Other plans as per orders.  Code Status: FULL Unk Lightning, MD. [facility] Pager [phone] 7pm to 7am.  03/11/2012, 5:14 AM

## 2012-03-12 DIAGNOSIS — R7989 Other specified abnormal findings of blood chemistry: Secondary | ICD-10-CM

## 2012-03-12 DIAGNOSIS — D62 Acute posthemorrhagic anemia: Secondary | ICD-10-CM

## 2012-03-12 DIAGNOSIS — K633 Ulcer of intestine: Secondary | ICD-10-CM

## 2012-03-12 LAB — GLUCOSE, CAPILLARY
Glucose-Capillary: 106 mg/dL — ABNORMAL HIGH (ref 70–99)
Glucose-Capillary: 110 mg/dL — ABNORMAL HIGH (ref 70–99)
Glucose-Capillary: 116 mg/dL — ABNORMAL HIGH (ref 70–99)

## 2012-03-12 LAB — HEMOGLOBIN AND HEMATOCRIT, BLOOD
HCT: 27.4 % — ABNORMAL LOW (ref 39.0–52.0)
Hemoglobin: 9.5 g/dL — ABNORMAL LOW (ref 13.0–17.0)

## 2012-03-12 NOTE — Progress Notes (Signed)
03/12/12 Patient to be discharged today. IV site removed. Discharge instructions reviewed with patient.

## 2012-03-12 NOTE — Progress Notes (Signed)
Patient ID: Kevin Sampson, male   DOB: [birth year], 51 y.o.   MRN: [id number] [facility] Progress Note  Subjective: Doing well, feels fine. No bleeding, had a "normal " Bm this am. HGB 9.3 stable  Objective:  Vital signs in last 24 hours: Temp:  [98 F (36.7 C)-98.5 F (36.9 C)] 98.2 F (36.8 C) (12/15 0629) Pulse Rate:  [70-81] 80  (12/15 0629) Resp:  [12-23] 18  (12/15 0629) BP: (101-131)/(65-87) 109/66 mmHg (12/15 0629) SpO2:  [94 %-100 %] 100 % (12/15 0629)   General:   Alert,  Well-developed,    in NAD Heart:  Regular rate and rhythm; no murmurs Pulm;clear Abdomen:  Soft, nontender and nondistended. Normal bowel sounds, without guarding, and without rebound.   Extremities:  Without edema. Neurologic:  Alert and  oriented x4;  grossly normal neurologically. Psych:  Alert and cooperative. Normal mood and affect.  Intake/Output from previous day: 12/14 0701 - 12/15 0700 In: 2484.2 [P.O.:120; I.V.:2364.2] Out: 5 [Urine:5] Intake/Output this shift: Total I/O In: 1250 [I.V.:1250] Out: -   Lab Results:  Basename 03/12/12 0942 03/12/12 0211 03/11/12 1651 03/10/12 1220  WBC -- -- -- 6.7  HGB 9.3* 9.5* 10.8* --  HCT 27.3* 27.4* 31.3* --  PLT -- -- -- 216   BMET  Basename 03/10/12 1220  NA 139  K 3.8  CL 104  CO2 28  GLUCOSE 158*  BUN 21  CREATININE 1.15  CALCIUM 8.7   LFT No results found for this basename: PROT,ALBUMIN,AST,ALT,ALKPHOS,BILITOT,BILIDIR,IBILI in the last 72 hours PT/INR  Basename 03/10/12 1220  LABPROT 13.2  INR 1.01      Assessment / Plan: 51 yo male with acute post polypectomy bleed from right colon polyp- s/p polypectomy 10 days ago. Stable s/p colonoscopy yesterday-found ulcer at polyp site/clean based and no blood in colon. Endoclip placed  X 1.  No bleeding past 24 hours, hgb has equilibrated  He is stable for discharge today, will advance diet. NO Aspirin  Next few weeks -told him start back on jan 1- and only a baby  ASA  Daily  He will follow up with his primary for cbc in 2 weeks   Active Problems:  OBESITY  HYPERTENSION  ASTHMA  HYPERGLYCEMIA  Rectal bleeding  H/O colonoscopy with polypectomy     LOS: 2 days   Amy Esterwood  03/12/2012, 12:15 PM    GI ATTENDING  Interval history reviewed. Agree with assessment as outlined above. No further bleeding. Okay to advance diet and discharge home later today. Dr.Hung will be made aware of the patient's hospital course.Verlon Au available as needed. Will sign off.  Wilhemina Bonito. Eda Keys., M.D. [facility] Division of Gastroenterology

## 2012-03-13 ENCOUNTER — Encounter (HOSPITAL_COMMUNITY): Payer: Self-pay | Admitting: Internal Medicine

## 2012-03-23 NOTE — Discharge Summary (Signed)
Physician Discharge Summary  Kevin Sampson WUJ:[id number] DOB: 04-11-1960 DOA: 03/10/2012  PCP: Evlyn Courier, MD  Admit date: 03/10/2012 Discharge date: 03/23/2012  Time spent: 40 minutes minutes  Recommendations for Outpatient Follow-up:  1. Followup with Dr. Elnoria Howard within 2 weeks  Discharge Diagnoses:  Active Problems:  OBESITY  HYPERTENSION  ASTHMA  HYPERGLYCEMIA  Rectal bleeding  H/O colonoscopy with polypectomy   Discharge Condition: Stable  Diet recommendation: Regular  Filed Weights   03/10/12 1202 03/10/12 1900  Weight: 93.441 kg (206 lb) 91.853 kg (202 lb 8 oz)    History of present illness:  Kevin Sampson is an 51 y.o. male with hx of DM on metformin, HTN, hyperlipidemia, presents to [facility] for several episode of painless rectal bleeding. He had a screening colonoscopy by Dr Sherri Rad 8 days ago, and at that time, had a small polyp removed. He denied CP, lightheadedness, N/V, black stool, or abdominal cramps or pain. He has had no fever or chills. Evaluation in [facility] showed stable hemodynamics, Hb of 12 g/DL. EDP spoke with GI and was recommended that he be admitted for follow up colonoscopy for bleeding after polypectomy.   Hospital Course:   1. Bright blood per rectum: Patient came in to the hospital complaining about anemia symptoms. Patient has some lightheadedness and almost had an episode of presyncope at home. He also does have bright red blood per rectum. Patient had recent colonoscopy and polypectomy done by Dr. Elnoria Howard. Hemoglobin dropped from 12.0 to 10.6. After admission to the hospital, GI was consulted, Dr. done. Was on call for Dr. Elnoria Howard and he did colonoscopy on the 14th showed nonbleeding ascending colon ulcer likely from the previous polypectomy, and this was clipped. Dr. Marina Goodell is okay for the patient to be discharged. Patient wanted to go home at that time.  2. Anemia: Acute blood loss anemia secondary to lower GI bleed, baseline hemoglobin is about 12.0, patient  presented with hemoglobin of 10.6 which dropped down to 10.6 on admission, it further dropped down to 9.5, but after the colonoscopy and reassure her that there is no active bleeding patient was felt okay to be discharged.  3. Hypertension: Controlled, on medication continued throughout the hospital stay. Patient did not have hypotension with his GI bleed.  Procedures:  Colonoscopy done on 03/11/2012 showed nonbleeding ulcer in the ascending colon status post Enod clipping therapy  Consultations:  Lenor Derrick of  gastroenterology  Discharge Exam: Filed Vitals:   03/11/12 1510 03/11/12 1520 03/11/12 2200 03/12/12 0629  BP: 104/71 103/70 115/78 109/66  Pulse:   70 80  Temp:   98.1 F (36.7 C) 98.2 F (36.8 C)  TempSrc:   Oral Oral  Resp: 16 23 20 18   Height:      Weight:      SpO2: 94% 95% 99% 100%   General: Alert and awake, oriented x3, not in any acute distress. HEENT: anicteric sclera, pupils reactive to light and accommodation, EOMI CVS: S1-S2 clear, no murmur rubs or gallops Chest: clear to auscultation bilaterally, no wheezing, rales or rhonchi Abdomen: soft nontender, nondistended, normal bowel sounds, no organomegaly Extremities: no cyanosis, clubbing or edema noted bilaterally Neuro: Cranial nerves II-XII intact, no focal neurological deficits  Discharge Instructions  Discharge Orders    Future Orders Please Complete By Expires   Diet - low sodium heart healthy      Increase activity slowly          Medication List     As of  03/23/2012  3:51 PM    STOP taking these medications         aspirin EC 325 MG tablet      TAKE these medications         amLODipine 5 MG tablet   Commonly known as: NORVASC   Take 5 mg by mouth daily.      famotidine 20 MG tablet   Commonly known as: PEPCID   Take 20 mg by mouth daily. Takes every once in a while      losartan-hydrochlorothiazide 100-25 MG per tablet   Commonly known as: HYZAAR   Take 1 tablet by  mouth daily.      metFORMIN 500 MG 24 hr tablet   Commonly known as: GLUCOPHAGE-XR   Take 500 mg by mouth daily with breakfast.      simvastatin 10 MG tablet   Commonly known as: ZOCOR   Take 10 mg by mouth at bedtime.           Follow-up Information    Follow up with HUNG,PATRICK D, MD. In 1 week.   Contact information:   [address] [place] [address] [phone]       Follow up with HILL,GERALD K, MD. In 1 week.   Contact information:   [address] [place] [address] [phone]           The results of significant diagnostics from this hospitalization (including imaging, microbiology, ancillary and laboratory) are listed below for reference.    Significant Diagnostic Studies: No results found.  Microbiology: No results found for this or any previous visit (from the past 240 hour(s)).   Labs: Basic Metabolic Panel: No results found for this basename: NA:5,K:5,CL:5,CO2:5,GLUCOSE:5,BUN:5,CREATININE:5,CALCIUM:5,MG:5,PHOS:5 in the last 168 hours Liver Function Tests: No results found for this basename: AST:5,ALT:5,ALKPHOS:5,BILITOT:5,PROT:5,ALBUMIN:5 in the last 168 hours No results found for this basename: LIPASE:5,AMYLASE:5 in the last 168 hours No results found for this basename: AMMONIA:5 in the last 168 hours CBC: No results found for this basename: WBC:5,NEUTROABS:5,HGB:5,HCT:5,MCV:5,PLT:5 in the last 168 hours Cardiac Enzymes: No results found for this basename: CKTOTAL:5,CKMB:5,CKMBINDEX:5,TROPONINI:5 in the last 168 hours BNP: BNP (last 3 results) No results found for this basename: PROBNP:3 in the last 8760 hours CBG: No results found for this basename: GLUCAP:5 in the last 168 hours     Signed:  [facility]  [facility] 03/23/2012, 3:51 PM   ]

## 2012-06-04 ENCOUNTER — Encounter (HOSPITAL_COMMUNITY): Payer: Self-pay

## 2012-06-04 ENCOUNTER — Emergency Department (HOSPITAL_COMMUNITY)
Admission: EM | Admit: 2012-06-04 | Discharge: 2012-06-04 | Disposition: A | Discharge status: Home / Self Care | Payer: 59 | Attending: Emergency Medicine | Admitting: Emergency Medicine

## 2012-06-04 DIAGNOSIS — Y939 Activity, unspecified: Secondary | ICD-10-CM | POA: Insufficient documentation

## 2012-06-04 DIAGNOSIS — E119 Type 2 diabetes mellitus without complications: Secondary | ICD-10-CM | POA: Insufficient documentation

## 2012-06-04 DIAGNOSIS — Z79899 Other long term (current) drug therapy: Secondary | ICD-10-CM | POA: Insufficient documentation

## 2012-06-04 DIAGNOSIS — Y929 Unspecified place or not applicable: Secondary | ICD-10-CM | POA: Insufficient documentation

## 2012-06-04 DIAGNOSIS — Z7982 Long term (current) use of aspirin: Secondary | ICD-10-CM | POA: Insufficient documentation

## 2012-06-04 DIAGNOSIS — I10 Essential (primary) hypertension: Secondary | ICD-10-CM | POA: Insufficient documentation

## 2012-06-04 DIAGNOSIS — L03019 Cellulitis of unspecified finger: Secondary | ICD-10-CM | POA: Insufficient documentation

## 2012-06-04 DIAGNOSIS — E7800 Pure hypercholesterolemia, unspecified: Secondary | ICD-10-CM | POA: Insufficient documentation

## 2012-06-04 DIAGNOSIS — W208XXA Other cause of strike by thrown, projected or falling object, initial encounter: Secondary | ICD-10-CM | POA: Insufficient documentation

## 2012-06-04 MED ORDER — LIDOCAINE HCL (PF) 2 % IJ SOLN
2.0000 mL | Freq: Once | INTRAMUSCULAR | Status: AC
  Administered 2012-06-04: 2 mL
  Filled 2012-06-04: qty 10

## 2012-06-04 MED ORDER — SULFAMETHOXAZOLE-TRIMETHOPRIM 800-160 MG PO TABS: 1.0000 | ORAL_TABLET | Freq: Two times a day (BID) | ORAL | Status: DC

## 2012-06-04 MED ORDER — HYDROCODONE-ACETAMINOPHEN 5-325 MG PO TABS: 1.0000 | ORAL_TABLET | ORAL | Status: DC | PRN

## 2012-06-04 MED ORDER — SULFAMETHOXAZOLE-TMP DS 800-160 MG PO TABS
1.0000 | ORAL_TABLET | Freq: Once | ORAL | Status: AC
  Administered 2012-06-04: 1 via ORAL
  Filled 2012-06-04: qty 1

## 2012-06-04 NOTE — ED Notes (Signed)
Mashed my left thumb 4 days ago and it's swollen and it hurts per pt.

## 2012-06-04 NOTE — ED Notes (Signed)
Pt Presents  4 days after dropping a box on left thumb. Thumb is swollen, red and oozing small amount serous fluid at base of thumb nail. Pt denies fever or red streaks at this time. NAD noted. Pt refers to pain as "sore". No needs voiced at this time.

## 2012-06-07 LAB — WOUND CULTURE

## 2012-06-08 NOTE — ED Notes (Signed)
+   Urine Patient treated with Septra-STS

## 2012-06-09 NOTE — ED Provider Notes (Signed)
History     CSN: [id number]  Arrival date & time 06/04/12  4132   First MD Initiated Contact with Patient 06/04/12 2014      Chief Complaint  Patient presents with  . Finger Injury    (Consider location/radiation/quality/duration/timing/severity/associated sxs/prior treatment) HPI Comments: Kevin Sampson is a 52 y.o. Male presenting with infection to his left distal thumb after accidentally dropping a box on the finger tip 4 days ago.  He has had slowly progressive pain,  Swelling and now redness with evidence for infection along the edge and base of his thumb nail.  The pain is constant and throbbing and improved with elevation and ice.  Pain is worsened with palpation.  There is no radiation of pain.       The history is provided by the patient.    Past Medical History  Diagnosis Date  . Diabetes mellitus   . Hypertension   . Hypercholesteremia     Past Surgical History  Procedure Laterality Date  . Cholecystectomy    . Colonosopy    . Colonoscopy  03/11/2012    Procedure: COLONOSCOPY;  Surgeon: Hilarie Fredrickson, MD;  Location: [facility] ENDOSCOPY;  Service: Endoscopy;  Laterality: N/A;    No family history on file.  History  Substance Use Topics  . Smoking status: Never Smoker   . Smokeless tobacco: Not on file  . Alcohol Use: No      Review of Systems  Constitutional: Negative for fever and chills.  HENT: Negative for facial swelling.   Respiratory: Negative for shortness of breath and wheezing.   Musculoskeletal: Negative for arthralgias.  Skin: Positive for color change and wound.  Neurological: Negative for weakness and numbness.    Allergies  Review of patient's allergies indicates no known allergies.  Home Medications   Current Outpatient Rx  Name  Route  Sig  Dispense  Refill  . amLODipine (NORVASC) 5 MG tablet   Oral   Take 5 mg by mouth daily.         Marland Kitchen aspirin EC 81 MG tablet   Oral   Take 81 mg by mouth every evening.         Marland Kitchen  losartan-hydrochlorothiazide (HYZAAR) 100-25 MG per tablet   Oral   Take 1 tablet by mouth daily.         . metFORMIN (GLUCOPHAGE-XR) 500 MG 24 hr tablet   Oral   Take 500 mg by mouth daily with breakfast.         . simvastatin (ZOCOR) 10 MG tablet   Oral   Take 10 mg by mouth at bedtime.         Marland Kitchen HYDROcodone-acetaminophen (NORCO/VICODIN) 5-325 MG per tablet   Oral   Take 1 tablet by mouth every 4 (four) hours as needed for pain.   20 tablet   0   . sulfamethoxazole-trimethoprim (SEPTRA DS) 800-160 MG per tablet   Oral   Take 1 tablet by mouth every 12 (twelve) hours.   20 tablet   0     BP 151/90  Pulse 98  Temp(Src) 97.9 F (36.6 C) (Oral)  Resp 20  Ht 5\' 6"  (1.676 m)  Wt 212 lb (96.163 kg)  BMI 34.23 kg/m2  SpO2 100%  Physical Exam  Constitutional: He appears well-developed and well-nourished. No distress.  HENT:  Head: Normocephalic.  Neck: Neck supple.  Cardiovascular: Normal rate.   Pulmonary/Chest: Effort normal. He has no wheezes.  Musculoskeletal: Normal range of motion.  He exhibits no edema.  Skin: There is erythema.  Edema and erythema along lateral and proximal edges of left thumb cuticle, yellow purulence noted within the proximal nail fold.  Localized erythema without red streaking.  No subungual hematoma appreciated.  Finger tuft is soft.  He is able to to flex the proximal and distal finger joints without increased pain.    ED Course  INCISION AND DRAINAGE Date/Time: 06/09/2012 3:06 PM Performed by: Burgess Amor Authorized by: Burgess Amor Consent: Verbal consent obtained. Risks and benefits: risks, benefits and alternatives were discussed Consent given by: patient Patient identity confirmed: verbally with patient Indications for incision and drainage: paronychia. Body area: upper extremity Location details: left thumb Anesthesia: digital block Local anesthetic: lidocaine 2% without epinephrine Anesthetic total: 2 ml Scalpel size:  11 Incision type: cuticle lifted from nail plate. Complexity: simple Drainage: purulent Drainage amount: moderate Wound treatment: wound left open Packing material: none Patient tolerance: Patient tolerated the procedure well with no immediate complications. Comments: Area flushed copiously with NS after purulence was drained.   (including critical care time)  Labs Reviewed  WOUND CULTURE   No results found.   1. Paronychia of thumb, left       MDM  Patients thumb was dressed,  Placed on septra, hydrocodone prn pain.  Encouraged warm soaks starting this evening.  Recheck by pcp or return here for a recheck for any problems,  Continued pain or infection.  Wound culture sent.  Of note, patient reports checking cbg daily, today was "around 150".  Not repeated here.        Burgess Amor, PA-C 06/09/12 1510

## 2012-06-12 NOTE — ED Provider Notes (Signed)
Medical screening examination/treatment/procedure(s) were performed by non-physician practitioner and as supervising physician I was immediately available for consultation/collaboration.   Shelda Jakes, MD 06/12/12 1126

## 2013-08-23 ENCOUNTER — Emergency Department (HOSPITAL_COMMUNITY): Payer: 59

## 2013-08-23 ENCOUNTER — Encounter (HOSPITAL_COMMUNITY): Payer: Self-pay | Admitting: Emergency Medicine

## 2013-08-23 ENCOUNTER — Emergency Department (HOSPITAL_COMMUNITY)
Admission: EM | Admit: 2013-08-23 | Discharge: 2013-08-23 | Disposition: A | Discharge status: Home / Self Care | Payer: 59 | Attending: Emergency Medicine | Admitting: Emergency Medicine

## 2013-08-23 DIAGNOSIS — W208XXA Other cause of strike by thrown, projected or falling object, initial encounter: Secondary | ICD-10-CM | POA: Insufficient documentation

## 2013-08-23 DIAGNOSIS — Y9389 Activity, other specified: Secondary | ICD-10-CM | POA: Insufficient documentation

## 2013-08-23 DIAGNOSIS — I10 Essential (primary) hypertension: Secondary | ICD-10-CM | POA: Insufficient documentation

## 2013-08-23 DIAGNOSIS — E119 Type 2 diabetes mellitus without complications: Secondary | ICD-10-CM | POA: Insufficient documentation

## 2013-08-23 DIAGNOSIS — Y9289 Other specified places as the place of occurrence of the external cause: Secondary | ICD-10-CM | POA: Insufficient documentation

## 2013-08-23 DIAGNOSIS — L03019 Cellulitis of unspecified finger: Secondary | ICD-10-CM | POA: Insufficient documentation

## 2013-08-23 DIAGNOSIS — Z79899 Other long term (current) drug therapy: Secondary | ICD-10-CM | POA: Insufficient documentation

## 2013-08-23 DIAGNOSIS — E7800 Pure hypercholesterolemia, unspecified: Secondary | ICD-10-CM | POA: Insufficient documentation

## 2013-08-23 DIAGNOSIS — Z7982 Long term (current) use of aspirin: Secondary | ICD-10-CM | POA: Insufficient documentation

## 2013-08-23 DIAGNOSIS — Z792 Long term (current) use of antibiotics: Secondary | ICD-10-CM | POA: Insufficient documentation

## 2013-08-23 MED ORDER — SULFAMETHOXAZOLE-TRIMETHOPRIM 800-160 MG PO TABS: 1.0000 | ORAL_TABLET | Freq: Two times a day (BID) | ORAL | Status: AC

## 2013-08-23 MED ORDER — LIDOCAINE HCL (PF) 2 % IJ SOLN
2.0000 mL | Freq: Once | INTRAMUSCULAR | Status: DC
  Filled 2013-08-23: qty 10

## 2013-08-23 MED ORDER — HYDROCODONE-ACETAMINOPHEN 5-325 MG PO TABS: 1.0000 | ORAL_TABLET | ORAL | Status: AC | PRN

## 2013-08-23 MED ORDER — LIDOCAINE HCL (PF) 1 % IJ SOLN
INTRAMUSCULAR | Status: DC
Start: 2013-08-23 — End: 2013-08-23
  Filled 2013-08-23: qty 5

## 2013-08-23 NOTE — ED Notes (Signed)
Pt states he smashed his finger a few days ago

## 2013-08-23 NOTE — Discharge Instructions (Signed)
Paronychia Paronychia is an inflammatory reaction involving the folds of the skin surrounding the fingernail. This is commonly caused by an infection in the skin around a nail. The most common cause of paronychia is frequent wetting of the hands (as seen with bartenders, food servers, nurses or others who wet their hands). This makes the skin around the fingernail susceptible to infection by bacteria (germs) or fungus. Other predisposing factors are:  Aggressive manicuring.  Nail biting.  Thumb sucking. The most common cause is a staphylococcal (a type of germ) infection, or a fungal (Candida) infection. When caused by a germ, it usually comes on suddenly with redness, swelling, pus and is often painful. It may get under the nail and form an abscess (collection of pus), or form an abscess around the nail. If the nail itself is infected with a fungus, the treatment is usually prolonged and may require oral medicine for up to one year. Your caregiver will determine the length of time treatment is required. The paronychia caused by bacteria (germs) may largely be avoided by not pulling on hangnails or picking at cuticles. When the infection occurs at the tips of the finger it is called felon. When the cause of paronychia is from the herpes simplex virus (HSV) it is called herpetic whitlow. TREATMENT  When an abscess is present treatment is often incision and drainage. This means that the abscess must be cut open so the pus can get out. When this is done, the following home care instructions should be followed. HOME CARE INSTRUCTIONS   It is important to keep the affected fingers very dry. Rubber or plastic gloves over cotton gloves should be used whenever the hand must be placed in water.  Keep wound clean, dry and dressed as suggested by your caregiver between warm soaks or warm compresses.  Soak in warm water for fifteen to twenty minutes three to four times per day for bacterial infections. Fungal  infections are very difficult to treat, so often require treatment for long periods of time.  For bacterial (germ) infections take antibiotics (medicine which kill germs) as directed and finish the prescription, even if the problem appears to be solved before the medicine is gone.  Only take over-the-counter or prescription medicines for pain, discomfort, or fever as directed by your caregiver. SEEK IMMEDIATE MEDICAL CARE IF:  You have redness, swelling, or increasing pain in the wound.  You notice pus coming from the wound.  You have a fever.  You notice a bad smell coming from the wound or dressing. Document Released: 09/08/2000 Document Revised: 06/07/2011 Document Reviewed: 05/10/2008 [facility] Patient Information 2014 [place], [place].   Soak your infected finger in warm Epsom salts twice daily for the next several days and apply gentle massage as discussed.  Keep it clean, dry and bandaged when not soaking it.  Complete your entire course of the antibiotic prescribed.  You may take the hydrocodone if needed for additional pain relief.  This will make you drowsy so do not drive within 4 hours of taking this.  Followup with your Dr. on Monday as planned.  Return here sooner for any worsened symptoms.

## 2013-08-24 NOTE — ED Provider Notes (Signed)
Medical screening examination/treatment/procedure(s) were performed by non-physician practitioner and as supervising physician I was immediately available for consultation/collaboration.   EKG Interpretation None        Vanetta Mulders, MD 08/24/13 1544

## 2013-08-24 NOTE — ED Provider Notes (Signed)
CSN: [id number]     Arrival date & time 08/23/13  1039 History   First MD Initiated Contact with Patient 08/23/13 1201     Chief Complaint  Patient presents with  . Finger Injury     (Consider location/radiation/quality/duration/timing/severity/associated sxs/prior Treatment) HPI Comments: Kevin Sampson is a 53 y.o. Male with a history of well controlled DM presenting with painful swelling to his left index finger since it was crushed by a large can of vegetables that fell off a shelf.  He has increasing redness,  Swelling and pain around the nail plate.  He has had no drainage from the site and denies any bleeding or skin injury from the initial trauma and denies any other injury.  His pain is sharp,  Throbbing and radiates into the proximal finger.  He has found no alleviators.  Palpation and dependency worsens the pain.  His blood glucose checked this am was normal range.     The history is provided by the patient.    Past Medical History  Diagnosis Date  . Diabetes mellitus   . Hypertension   . Hypercholesteremia    Past Surgical History  Procedure Laterality Date  . Cholecystectomy    . Colonosopy    . Colonoscopy  03/11/2012    Procedure: COLONOSCOPY;  Surgeon: Hilarie Fredrickson, MD;  Location: [facility] ENDOSCOPY;  Service: Endoscopy;  Laterality: N/A;   No family history on file. History  Substance Use Topics  . Smoking status: Never Smoker   . Smokeless tobacco: Not on file  . Alcohol Use: No    Review of Systems  Constitutional: Negative for fever and chills.  Respiratory: Negative for shortness of breath and wheezing.   Musculoskeletal: Positive for arthralgias.  Skin: Positive for color change.  Neurological: Negative for numbness.      Allergies  Review of patient's allergies indicates no known allergies.  Home Medications   Prior to Admission medications   Medication Sig Start Date End Date Taking? Authorizing Provider  amLODipine (NORVASC) 5 MG tablet Take 5  mg by mouth daily.   Yes Historical Provider, MD  aspirin EC 81 MG tablet Take 81 mg by mouth every evening.   Yes Historical Provider, MD  losartan-hydrochlorothiazide (HYZAAR) 100-25 MG per tablet Take 1 tablet by mouth daily.   Yes Historical Provider, MD  metFORMIN (GLUCOPHAGE-XR) 500 MG 24 hr tablet Take 500 mg by mouth daily with breakfast.   Yes Historical Provider, MD  simvastatin (ZOCOR) 10 MG tablet Take 10 mg by mouth at bedtime.   Yes Historical Provider, MD  HYDROcodone-acetaminophen (NORCO/VICODIN) 5-325 MG per tablet Take 1 tablet by mouth every 4 (four) hours as needed. 08/23/13   Burgess Amor, PA-C  sulfamethoxazole-trimethoprim (SEPTRA DS) 800-160 MG per tablet Take 1 tablet by mouth every 12 (twelve) hours. 08/23/13   Burgess Amor, PA-C   BP 137/88  Pulse 73  Temp(Src) 98.2 F (36.8 C) (Oral)  Resp 18  Ht 5\' 7"  (1.702 m)  Wt 208 lb (94.348 kg)  BMI 32.57 kg/m2  SpO2 100% Physical Exam  Constitutional: He appears well-developed and well-nourished.  HENT:  Head: Atraumatic.  Neck: Normal range of motion.  Cardiovascular:  Pulses equal bilaterally  Musculoskeletal: He exhibits edema and tenderness.       Hands: Edema and erythema with obvious paronychia around proximal nail fold.  No red streaking.  Distal sensation intact.  Neurological: He is alert. He has normal strength. He displays normal reflexes. No sensory deficit.  Skin: Skin is warm and dry.  Psychiatric: He has a normal mood and affect.    ED Course  INCISION AND DRAINAGE Date/Time: 08/23/2013 1:25 PM Performed by: Burgess AmorIDOL, Jakarius Flamenco Authorized by: Burgess AmorIDOL, Riaan Toledo Consent: Verbal consent obtained. Risks and benefits: risks, benefits and alternatives were discussed Consent given by: patient Patient understanding: patient states understanding of the procedure being performed Indications for incision and drainage: paronychia. Body area: upper extremity Location details: left index finger Anesthesia: digital  block Local anesthetic: lidocaine 2% without epinephrine Anesthetic total: 1.5 ml Scalpel size: 11 Incision type: cuticle edge lifted from nail plate. Complexity: simple Drainage: purulent Drainage amount: moderate Wound treatment: wound left open Packing material: none Patient tolerance: Patient tolerated the procedure well with no immediate complications. Comments: Flushed copiously with NS.  Telfa, bulky dressing applied.   (including critical care time) Labs Review Labs Reviewed - No data to display  Imaging Review Dg Finger Index Left  08/23/2013   CLINICAL DATA:  Left index finger crush injury.  EXAM: LEFT INDEX FINGER 2+V  COMPARISON:  None.  FINDINGS: Soft tissue swelling noted at the tip of the left index finger, most pronounced at the base of the nail. No underlying bony abnormality. No fracture, subluxation or dislocation. No radiopaque foreign body.  IMPRESSION: No acute bony abnormality.   Electronically Signed   By: Kevin Sampson M.D.   On: 08/23/2013 12:35     EKG Interpretation None      MDM   Final diagnoses:  Paronychia of finger    Pt is utd with tetanus.  Encouraged warm soaks bid, septra, hydrocodone. F/u with pcp (pt has appt in 4 days with Dr. Loleta ChanceHill).    Burgess AmorJulie Tavita Eastham, PA-C 08/24/13 1105

## 2017-01-20 ENCOUNTER — Emergency Department (HOSPITAL_COMMUNITY)
Admission: EM | Admit: 2017-01-20 | Discharge: 2017-01-20 | Disposition: A | Discharge status: Home / Self Care | Payer: BLUE CROSS/BLUE SHIELD | Attending: Emergency Medicine | Admitting: Emergency Medicine

## 2017-01-20 ENCOUNTER — Emergency Department (HOSPITAL_COMMUNITY): Payer: BLUE CROSS/BLUE SHIELD

## 2017-01-20 ENCOUNTER — Encounter (HOSPITAL_COMMUNITY): Payer: Self-pay | Admitting: Emergency Medicine

## 2017-01-20 DIAGNOSIS — Z79899 Other long term (current) drug therapy: Secondary | ICD-10-CM | POA: Insufficient documentation

## 2017-01-20 DIAGNOSIS — I10 Essential (primary) hypertension: Secondary | ICD-10-CM | POA: Insufficient documentation

## 2017-01-20 DIAGNOSIS — E119 Type 2 diabetes mellitus without complications: Secondary | ICD-10-CM | POA: Insufficient documentation

## 2017-01-20 DIAGNOSIS — J45909 Unspecified asthma, uncomplicated: Secondary | ICD-10-CM | POA: Diagnosis not present

## 2017-01-20 DIAGNOSIS — M791 Myalgia, unspecified site: Secondary | ICD-10-CM | POA: Diagnosis not present

## 2017-01-20 DIAGNOSIS — Z7984 Long term (current) use of oral hypoglycemic drugs: Secondary | ICD-10-CM | POA: Insufficient documentation

## 2017-01-20 DIAGNOSIS — R079 Chest pain, unspecified: Secondary | ICD-10-CM | POA: Diagnosis present

## 2017-01-20 LAB — BASIC METABOLIC PANEL
Anion gap: 10 (ref 5–15)
BUN: 15 mg/dL (ref 6–20)
CALCIUM: 9.4 mg/dL (ref 8.9–10.3)
CO2: 26 mmol/L (ref 22–32)
Chloride: 101 mmol/L (ref 101–111)
Creatinine, Ser: 1.05 mg/dL (ref 0.61–1.24)
GFR calc Af Amer: >60 mL/min (ref >60)
GFR calc non Af Amer: >60 mL/min (ref >60)
GLUCOSE: 97 mg/dL (ref 65–99)
POTASSIUM: 3.5 mmol/L (ref 3.5–5.1)
Sodium: 137 mmol/L (ref 135–145)

## 2017-01-20 LAB — POCT I-STAT TROPONIN I: Troponin i, poc: 0 ng/mL (ref 0.00–0.08)

## 2017-01-20 LAB — CBC
HEMATOCRIT: 39.2 % (ref 39.0–52.0)
Hemoglobin: 13.2 g/dL (ref 13.0–17.0)
MCH: 27.8 pg (ref 26.0–34.0)
MCHC: 33.7 g/dL (ref 30.0–36.0)
MCV: 82.5 fL (ref 78.0–100.0)
Platelets: 199 10*3/uL (ref 150–400)
RBC: 4.75 MIL/uL (ref 4.22–5.81)
RDW: 13.9 % (ref 11.5–15.5)
WBC: 5.6 10*3/uL (ref 4.0–10.5)

## 2017-01-20 MED ORDER — NAPROXEN 500 MG PO TABS: 500.0000 mg | ORAL_TABLET | Freq: Two times a day (BID) | ORAL | 0 refills | Status: AC

## 2017-01-20 NOTE — ED Triage Notes (Signed)
Patient complains of muscle pain in left breast. Patient states started 1 week ago after lifting stock at work.

## 2017-01-20 NOTE — ED Notes (Signed)
Pt ambulatory to waiting room. Pt verbalized understanding of discharge instructions.   

## 2017-01-20 NOTE — Discharge Instructions (Signed)
Your episode of pain over the last few weeks is not from your heart  Your x-ray shows calcifications around your heart. It is recommended that you see a cardiologist routinely for outpatient testing

## 2017-01-20 NOTE — ED Provider Notes (Signed)
[facility] Provider Note   CSN: [id number] Arrival date & time: 01/20/17  1713     History   Chief Complaint Chief Complaint  Patient presents with  . Muscle Pain  . Chest Pain    HPI Kevin Sampson is a 56 y.o. male. Chief complaint is left chest pain.  HPI:  56 year old male. 10 days ago was lifting a case of bottle water onto a first shock. Felt some pain in his left chest. It has persisted around some other family members tonight that her "nurse's" encouraged him to come be checked to "make sure it's not my heart. It hurts to touch on his left chest remove his left arm. He does not have shortness of breath or exertional symptoms. No history of known coronary artery disease. History of hypertension.  Past Medical History:  Diagnosis Date  . Diabetes mellitus   . Hypercholesteremia   . Hypertension     Patient Active Problem List   Diagnosis Date Noted  . Rectal bleeding 03/10/2012  . H/O colonoscopy with polypectomy 03/10/2012  . OBESITY 05/29/2008  . ERECTILE DYSFUNCTION 06/13/2007  . ASTHMA 11/16/2006  . HYPERLIPIDEMIA 03/12/2006  . HYPERTENSION 03/12/2006  . ALLERGIC RHINITIS 03/12/2006  . GERD 03/12/2006  . HYPERGLYCEMIA 03/12/2006    Past Surgical History:  Procedure Laterality Date  . CHOLECYSTECTOMY    . COLONOSCOPY  03/11/2012   Procedure: COLONOSCOPY;  Surgeon: Hilarie FredricksonJohn N Perry, MD;  [facility];  Service: Endoscopy;  Laterality: N/A;  . colonosopy         Home Medications    Prior to Admission medications   Medication Sig Start Date End Date Taking? Authorizing Provider  amLODipine (NORVASC) 5 MG tablet Take 5 mg by mouth daily.    [provider]  aspirin EC 81 MG tablet Take 81 mg by mouth every evening.    [provider]  HYDROcodone-acetaminophen (NORCO/VICODIN) 5-325 MG per tablet Take 1 tablet by mouth every 4 (four) hours as needed. 08/23/13   Burgess AmorIdol, Julie, PA-C  losartan-hydrochlorothiazide  (HYZAAR) 100-25 MG per tablet Take 1 tablet by mouth daily.    [provider]  metFORMIN (GLUCOPHAGE-XR) 500 MG 24 hr tablet Take 500 mg by mouth daily with breakfast.    [provider]  naproxen (NAPROSYN) 500 MG tablet Take 1 tablet (500 mg total) by mouth 2 (two) times daily. 01/20/17   Rolland PorterJames, Inge Waldroup, MD  simvastatin (ZOCOR) 10 MG tablet Take 10 mg by mouth at bedtime.    [provider]  sulfamethoxazole-trimethoprim (SEPTRA DS) 800-160 MG per tablet Take 1 tablet by mouth every 12 (twelve) hours. 08/23/13   Burgess AmorIdol, Julie, PA-C    Family History No family history on file.  Social History Social History  Substance Use Topics  . Smoking status: Never Smoker  . Smokeless tobacco: Never Used  . Alcohol use No     Allergies   Patient has no known allergies.   Review of Systems Review of Systems  Constitutional: Negative for appetite change, chills, diaphoresis, fatigue and fever.  HENT: Negative for mouth sores, sore throat and trouble swallowing.   Eyes: Negative for visual disturbance.  Respiratory: Negative for cough, chest tightness, shortness of breath and wheezing.   Cardiovascular: Positive for chest pain.  Gastrointestinal: Negative for abdominal distention, abdominal pain, diarrhea, nausea and vomiting.  Endocrine: Negative for polydipsia, polyphagia and polyuria.  Genitourinary: Negative for dysuria, frequency and hematuria.  Musculoskeletal: Negative for gait problem.  Skin: Negative  for color change, pallor and rash.  Neurological: Negative for dizziness, syncope, light-headedness and headaches.  Hematological: Does not bruise/bleed easily.  Psychiatric/Behavioral: Negative for behavioral problems and confusion.     Physical Exam Updated Vital Signs BP (!) 135/93   Pulse 73   Temp 98.4 F (36.9 C) (Oral)   Resp 17   SpO2 100%   Physical Exam  Constitutional: He is oriented to person, place, and time. He appears well-developed and  well-nourished. No distress.  HENT:  Head: Normocephalic.  Eyes: Pupils are equal, round, and reactive to light. Conjunctivae are normal. No scleral icterus.  Neck: Normal range of motion. Neck supple. No thyromegaly present.  Cardiovascular: Normal rate and regular rhythm.  Exam reveals no gallop and no friction rub.   No murmur heard. Tenderness left pectoralis. Pain with a "punching" maneuver. Has pain with passive posterior extension of the left arm at the shoulder or Pectoralis stretch.  Pulmonary/Chest: Effort normal and breath sounds normal. No respiratory distress. He has no wheezes. He has no rales.  Abdominal: Soft. Bowel sounds are normal. He exhibits no distension. There is no tenderness. There is no rebound.  Musculoskeletal: Normal range of motion.  Neurological: He is alert and oriented to person, place, and time.  Skin: Skin is warm and dry. No rash noted.  Psychiatric: He has a normal mood and affect. His behavior is normal.     ED Treatments / Results  Labs (all labs ordered are listed, but only abnormal results are displayed) Labs Reviewed  BASIC METABOLIC PANEL  CBC  I-STAT TROPONIN, ED  POCT I-STAT TROPONIN I    EKG  EKG Interpretation None       Radiology Dg Chest 2 View  Result Date: 01/20/2017 CLINICAL DATA:  56 year old male with right-sided chest pain and cough. EXAM: CHEST  2 VIEW COMPARISON:  Chest radiograph dated 01/22/2009 FINDINGS: The lungs are clear. There is no pleural effusion or pneumothorax. The cardiac silhouette is within normal limits. Curvilinear density projecting over the left heart may represent coronary vascular calcification versus calcification of the mitral annulus. No acute osseous pathology identified. IMPRESSION: 1. No acute cardiopulmonary process. 2. Probable coronary vascular calcification. Electronically Signed   By: Elgie Collard M.D.   On: 01/20/2017 18:07    Procedures Procedures (including critical care  time)  Medications Ordered in ED Medications - No data to display   Initial Impression / Assessment and Plan / ED Course  I have reviewed the triage vital signs and the nursing notes.  Pertinent labs & imaging results that were available during my care of the patient were reviewed by me and considered in my medical decision making (see chart for details).     EKG, troponin, chest x-ray normal. Pain is  consistent with musculoskeletal pain.  He is appropriate for discharge without further studies.  Of note his chest x-ray did suggest calcifications of either his mitral annulus or his coronary arteries.  I have given him outpatient cardiology referral and asked him to call for follow-up and explained my reasoning.  Final Clinical Impressions(s) / ED Diagnoses   Final diagnoses:  Muscular pain    New Prescriptions Discharge Medication List as of 01/20/2017  7:53 PM    START taking these medications   Details  naproxen (NAPROSYN) 500 MG tablet Take 1 tablet (500 mg total) by mouth 2 (two) times daily., Starting Thu 01/20/2017, Print         Rolland Porter, MD 01/20/17 2043

## 2017-04-27 DIAGNOSIS — E118 Type 2 diabetes mellitus with unspecified complications: Secondary | ICD-10-CM | POA: Diagnosis not present

## 2017-04-27 DIAGNOSIS — E785 Hyperlipidemia, unspecified: Secondary | ICD-10-CM | POA: Diagnosis not present

## 2017-04-27 DIAGNOSIS — I10 Essential (primary) hypertension: Secondary | ICD-10-CM | POA: Diagnosis not present

## 2017-09-13 DIAGNOSIS — E785 Hyperlipidemia, unspecified: Secondary | ICD-10-CM | POA: Diagnosis not present

## 2017-09-13 DIAGNOSIS — E118 Type 2 diabetes mellitus with unspecified complications: Secondary | ICD-10-CM | POA: Diagnosis not present

## 2017-09-13 DIAGNOSIS — I10 Essential (primary) hypertension: Secondary | ICD-10-CM | POA: Diagnosis not present

## 2017-09-20 DIAGNOSIS — L03011 Cellulitis of right finger: Secondary | ICD-10-CM | POA: Diagnosis not present

## 2017-10-25 DIAGNOSIS — M542 Cervicalgia: Secondary | ICD-10-CM | POA: Diagnosis not present

## 2017-11-07 DIAGNOSIS — M542 Cervicalgia: Secondary | ICD-10-CM | POA: Diagnosis not present

## 2018-01-03 DIAGNOSIS — E118 Type 2 diabetes mellitus with unspecified complications: Secondary | ICD-10-CM | POA: Diagnosis not present

## 2018-01-03 DIAGNOSIS — Z23 Encounter for immunization: Secondary | ICD-10-CM | POA: Diagnosis not present

## 2018-01-03 DIAGNOSIS — Z6835 Body mass index (BMI) 35.0-35.9, adult: Secondary | ICD-10-CM | POA: Diagnosis not present

## 2018-01-03 DIAGNOSIS — I10 Essential (primary) hypertension: Secondary | ICD-10-CM | POA: Diagnosis not present

## 2018-04-04 DIAGNOSIS — I10 Essential (primary) hypertension: Secondary | ICD-10-CM | POA: Diagnosis not present

## 2018-04-04 DIAGNOSIS — Z6835 Body mass index (BMI) 35.0-35.9, adult: Secondary | ICD-10-CM | POA: Diagnosis not present

## 2018-04-04 DIAGNOSIS — E118 Type 2 diabetes mellitus with unspecified complications: Secondary | ICD-10-CM | POA: Diagnosis not present

## 2018-05-06 DIAGNOSIS — E118 Type 2 diabetes mellitus with unspecified complications: Secondary | ICD-10-CM | POA: Diagnosis not present

## 2018-05-06 DIAGNOSIS — I10 Essential (primary) hypertension: Secondary | ICD-10-CM | POA: Diagnosis not present

## 2018-05-25 DIAGNOSIS — H52203 Unspecified astigmatism, bilateral: Secondary | ICD-10-CM | POA: Diagnosis not present

## 2018-05-25 DIAGNOSIS — H5203 Hypermetropia, bilateral: Secondary | ICD-10-CM | POA: Diagnosis not present

## 2018-05-25 DIAGNOSIS — E119 Type 2 diabetes mellitus without complications: Secondary | ICD-10-CM | POA: Diagnosis not present

## 2018-06-19 DIAGNOSIS — I872 Venous insufficiency (chronic) (peripheral): Secondary | ICD-10-CM | POA: Diagnosis not present

## 2018-06-19 DIAGNOSIS — E1169 Type 2 diabetes mellitus with other specified complication: Secondary | ICD-10-CM | POA: Diagnosis not present

## 2018-06-19 DIAGNOSIS — I10 Essential (primary) hypertension: Secondary | ICD-10-CM | POA: Diagnosis not present

## 2018-07-19 IMAGING — DX DG CHEST 2V
2 series · 2 of 2 positions shown · non-contrast
Comparison: Chest radiograph dated 01/22/2009

CLINICAL DATA: 56-year-old male with right-sided chest pain and
cough.

EXAM:
CHEST  2 VIEW

[chest pa]
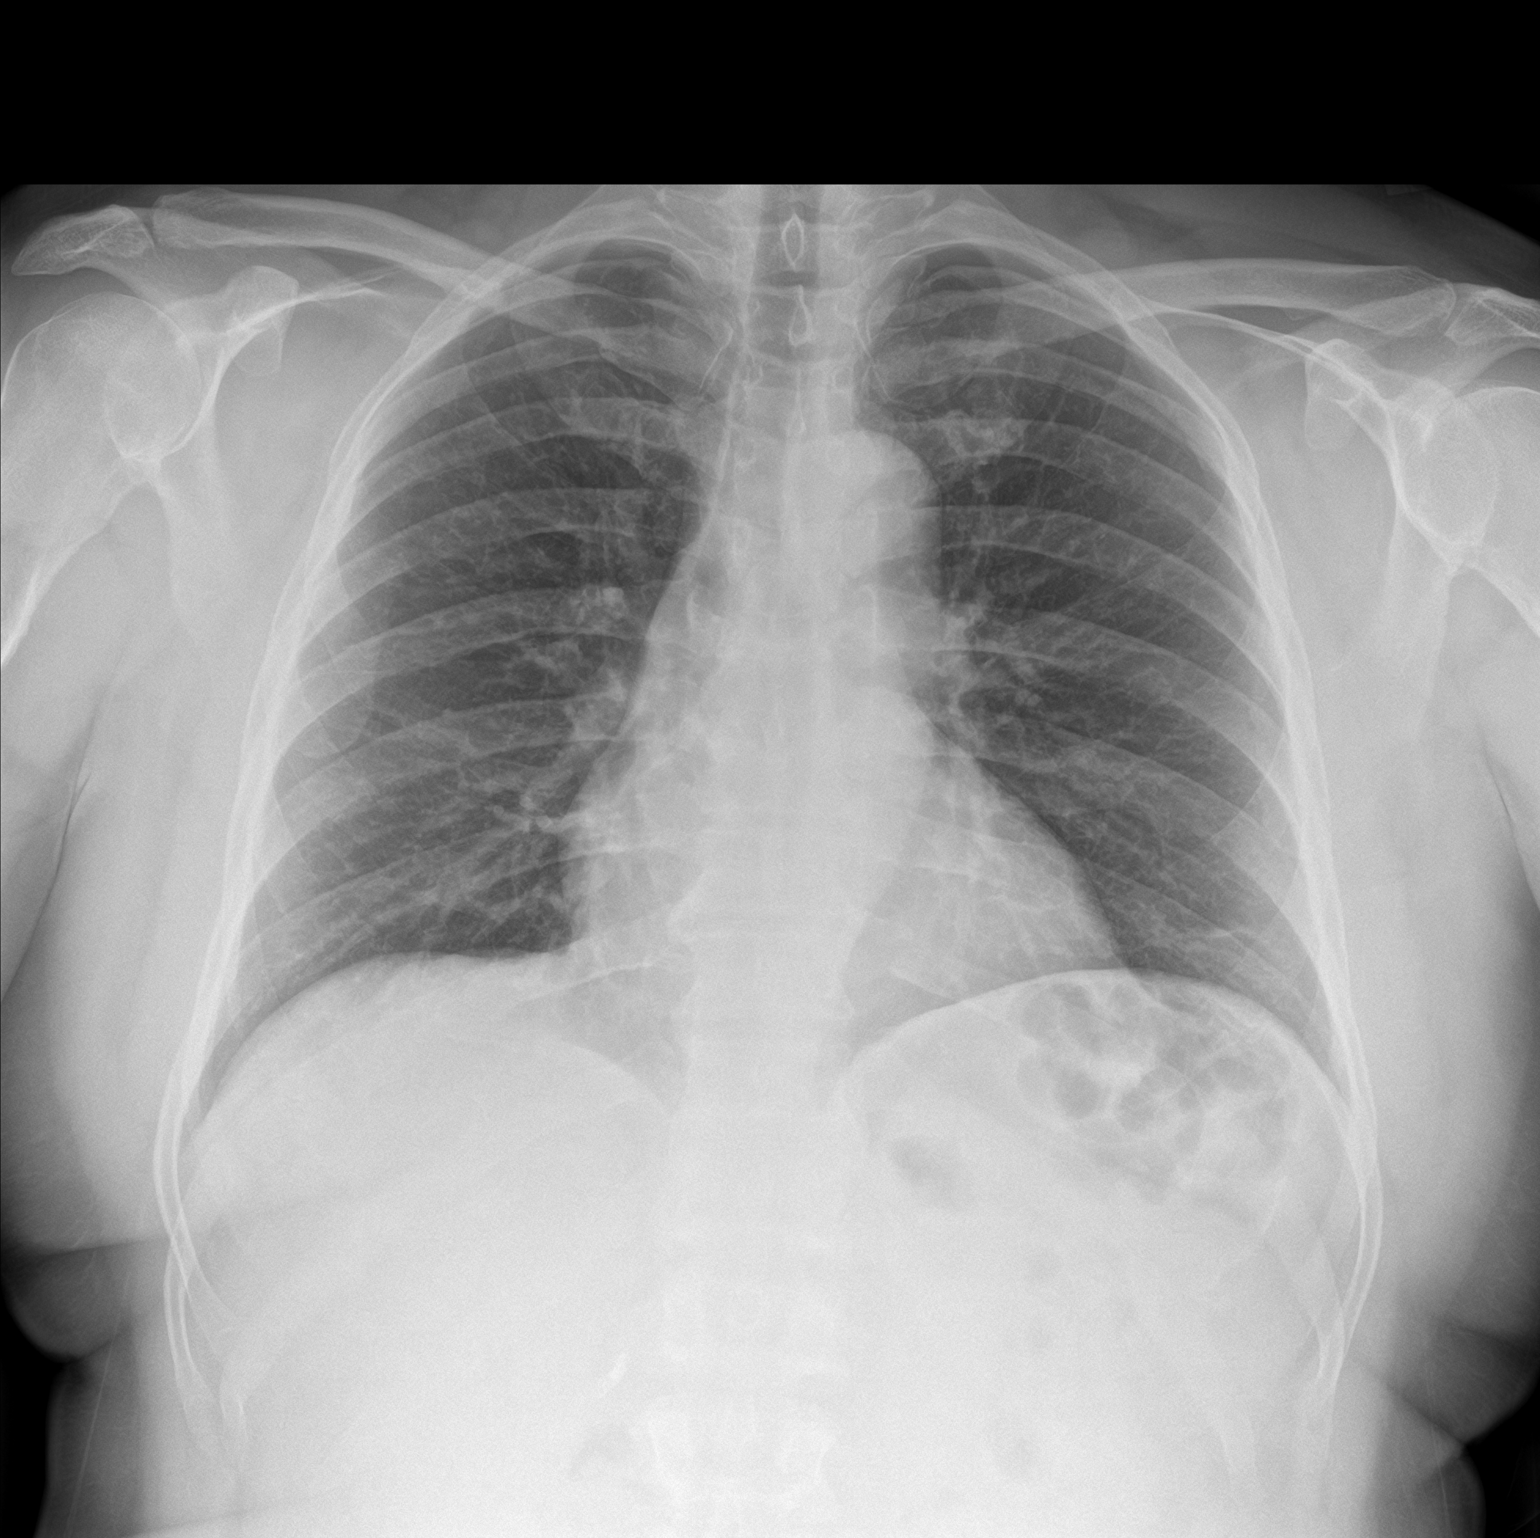

[chest lat]
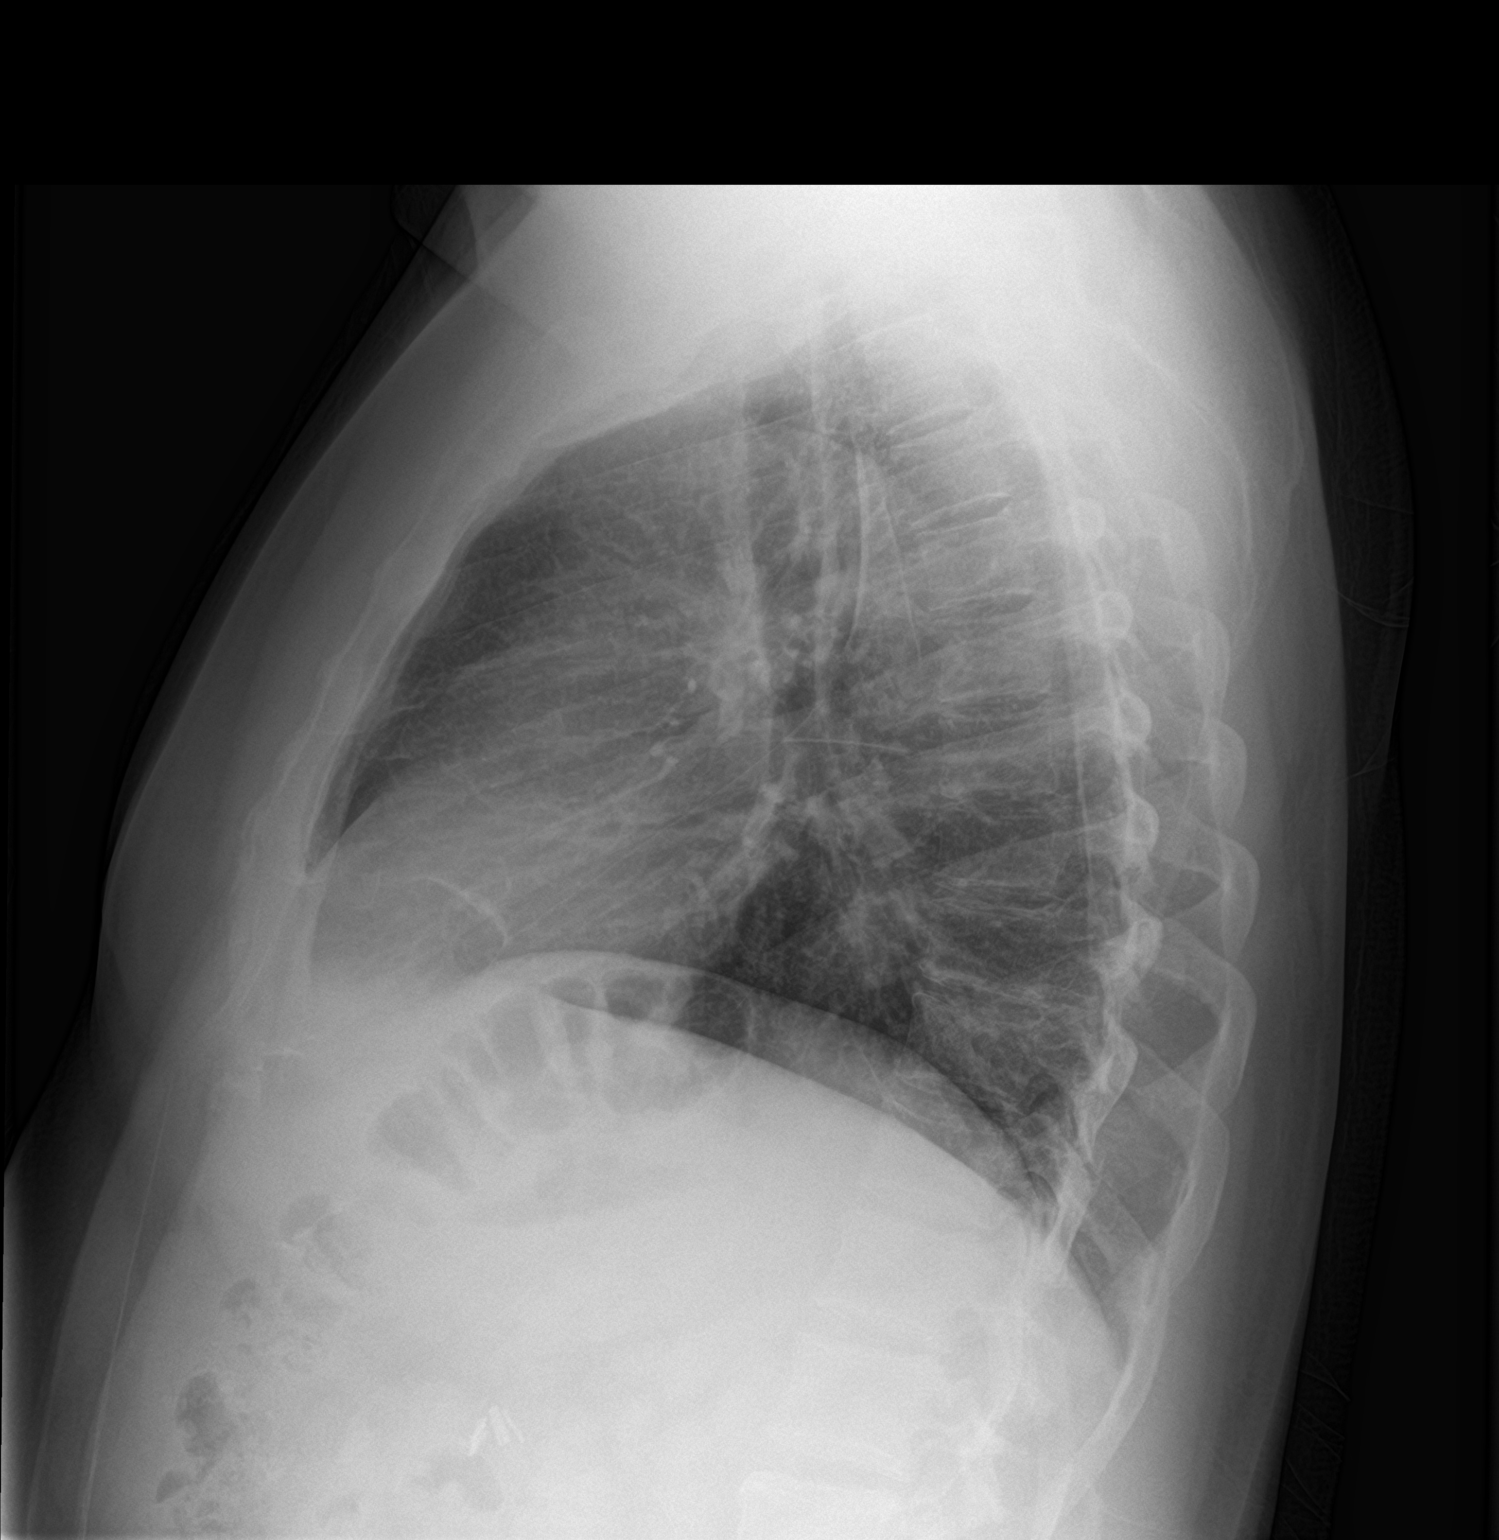

[2 of 2 positions shown; findings below may reference images not displayed]

FINDINGS: The lungs are clear. There is no pleural effusion or pneumothorax.
The cardiac silhouette is within normal limits. Curvilinear density
projecting over the left heart may represent coronary vascular
calcification versus calcification of the mitral annulus. No acute
osseous pathology identified.
IMPRESSION: 1. No acute cardiopulmonary process.
2. Probable coronary vascular calcification.

## 2018-09-05 DIAGNOSIS — I10 Essential (primary) hypertension: Secondary | ICD-10-CM | POA: Diagnosis not present

## 2018-09-05 DIAGNOSIS — E1169 Type 2 diabetes mellitus with other specified complication: Secondary | ICD-10-CM | POA: Diagnosis not present

## 2019-01-08 DIAGNOSIS — I10 Essential (primary) hypertension: Secondary | ICD-10-CM | POA: Diagnosis not present

## 2019-01-08 DIAGNOSIS — Z7189 Other specified counseling: Secondary | ICD-10-CM | POA: Diagnosis not present

## 2019-01-08 DIAGNOSIS — E1169 Type 2 diabetes mellitus with other specified complication: Secondary | ICD-10-CM | POA: Diagnosis not present

## 2019-05-14 DIAGNOSIS — I10 Essential (primary) hypertension: Secondary | ICD-10-CM | POA: Diagnosis not present

## 2019-05-14 DIAGNOSIS — Z125 Encounter for screening for malignant neoplasm of prostate: Secondary | ICD-10-CM | POA: Diagnosis not present

## 2019-05-14 DIAGNOSIS — N400 Enlarged prostate without lower urinary tract symptoms: Secondary | ICD-10-CM | POA: Diagnosis not present

## 2019-05-14 DIAGNOSIS — E1169 Type 2 diabetes mellitus with other specified complication: Secondary | ICD-10-CM | POA: Diagnosis not present

## 2019-05-14 DIAGNOSIS — E785 Hyperlipidemia, unspecified: Secondary | ICD-10-CM | POA: Diagnosis not present

## 2019-06-25 ENCOUNTER — Ambulatory Visit: Payer: BLUE CROSS/BLUE SHIELD

## 2019-07-09 ENCOUNTER — Ambulatory Visit: Payer: Self-pay | Attending: Internal Medicine

## 2019-07-09 DIAGNOSIS — Z23 Encounter for immunization: Secondary | ICD-10-CM

## 2019-07-09 NOTE — Progress Notes (Signed)
   Covid-19 Vaccination Clinic  Name:  WARD BOISSONNEAULT    MRN: [id number] DOB: Aug 02, 1960  07/09/2019  Mr. Dehne was observed post Covid-19 immunization for 15 minutes without incident. He was provided with Vaccine Information Sheet and instruction to access the V-Safe system.   Mr. Marti was instructed to call 911 with any severe reactions post vaccine: Marland Kitchen Difficulty breathing  . Swelling of face and throat  . A fast heartbeat  . A bad rash all over body  . Dizziness and weakness   Immunizations Administered    Name Date Dose VIS Date Route   Pfizer COVID-19 Vaccine 07/09/2019 12:25 PM 0.3 mL 03/09/2019 Intramuscular   Manufacturer: [employer], [employer]   Lot: [id number]   NDC: [address]-5

## 2019-08-01 ENCOUNTER — Ambulatory Visit: Payer: Self-pay | Attending: Internal Medicine

## 2019-08-01 DIAGNOSIS — Z23 Encounter for immunization: Secondary | ICD-10-CM

## 2019-08-01 NOTE — Progress Notes (Signed)
   Covid-19 Vaccination Clinic  Name:  CONNER MUEGGE    MRN: [id number] DOB: 08-07-1960  08/01/2019  Mr. Hulet was observed post Covid-19 immunization for 15 minutes without incident. He was provided with Vaccine Information Sheet and instruction to access the V-Safe system.   Mr. Maberry was instructed to call 911 with any severe reactions post vaccine: Marland Kitchen Difficulty breathing  . Swelling of face and throat  . A fast heartbeat  . A bad rash all over body  . Dizziness and weakness   Immunizations Administered    Name Date Dose VIS Date Route   Pfizer COVID-19 Vaccine 08/01/2019  8:44 AM 0.3 mL 05/23/2018 Intramuscular   Manufacturer: [employer], [employer]   Lot: [id number]   NDC: [address]-1

## 2019-09-24 DIAGNOSIS — E1169 Type 2 diabetes mellitus with other specified complication: Secondary | ICD-10-CM | POA: Diagnosis not present

## 2019-09-24 DIAGNOSIS — I10 Essential (primary) hypertension: Secondary | ICD-10-CM | POA: Diagnosis not present

## 2019-09-24 DIAGNOSIS — E785 Hyperlipidemia, unspecified: Secondary | ICD-10-CM | POA: Diagnosis not present

## 2020-01-28 DIAGNOSIS — E785 Hyperlipidemia, unspecified: Secondary | ICD-10-CM | POA: Diagnosis not present

## 2020-01-28 DIAGNOSIS — I10 Essential (primary) hypertension: Secondary | ICD-10-CM | POA: Diagnosis not present

## 2020-01-28 DIAGNOSIS — Z23 Encounter for immunization: Secondary | ICD-10-CM | POA: Diagnosis not present

## 2020-01-28 DIAGNOSIS — E1169 Type 2 diabetes mellitus with other specified complication: Secondary | ICD-10-CM | POA: Diagnosis not present

## 2020-04-08 DIAGNOSIS — E1169 Type 2 diabetes mellitus with other specified complication: Secondary | ICD-10-CM | POA: Diagnosis not present

## 2020-04-08 DIAGNOSIS — I10 Essential (primary) hypertension: Secondary | ICD-10-CM | POA: Diagnosis not present

## 2020-05-26 DIAGNOSIS — I10 Essential (primary) hypertension: Secondary | ICD-10-CM | POA: Diagnosis not present

## 2020-05-26 DIAGNOSIS — E1169 Type 2 diabetes mellitus with other specified complication: Secondary | ICD-10-CM | POA: Diagnosis not present

## 2020-08-12 DIAGNOSIS — I10 Essential (primary) hypertension: Secondary | ICD-10-CM | POA: Diagnosis not present

## 2020-08-12 DIAGNOSIS — E1169 Type 2 diabetes mellitus with other specified complication: Secondary | ICD-10-CM | POA: Diagnosis not present

## 2020-12-02 DIAGNOSIS — I10 Essential (primary) hypertension: Secondary | ICD-10-CM | POA: Diagnosis not present

## 2020-12-02 DIAGNOSIS — E1165 Type 2 diabetes mellitus with hyperglycemia: Secondary | ICD-10-CM | POA: Diagnosis not present

## 2020-12-02 DIAGNOSIS — M65351 Trigger finger, right little finger: Secondary | ICD-10-CM | POA: Diagnosis not present

## 2020-12-02 DIAGNOSIS — Z6835 Body mass index (BMI) 35.0-35.9, adult: Secondary | ICD-10-CM | POA: Diagnosis not present

## 2020-12-02 DIAGNOSIS — E785 Hyperlipidemia, unspecified: Secondary | ICD-10-CM | POA: Diagnosis not present

## 2021-02-02 DIAGNOSIS — E1169 Type 2 diabetes mellitus with other specified complication: Secondary | ICD-10-CM | POA: Diagnosis not present

## 2021-02-02 DIAGNOSIS — I10 Essential (primary) hypertension: Secondary | ICD-10-CM | POA: Diagnosis not present

## 2021-02-02 DIAGNOSIS — Z23 Encounter for immunization: Secondary | ICD-10-CM | POA: Diagnosis not present

## 2021-02-02 DIAGNOSIS — Z125 Encounter for screening for malignant neoplasm of prostate: Secondary | ICD-10-CM | POA: Diagnosis not present

## 2022-11-29 ENCOUNTER — Encounter (HOSPITAL_COMMUNITY): Payer: Self-pay | Admitting: Emergency Medicine

## 2022-11-29 ENCOUNTER — Emergency Department (HOSPITAL_COMMUNITY): Payer: Self-pay

## 2022-11-29 ENCOUNTER — Other Ambulatory Visit: Payer: Self-pay

## 2022-11-29 ENCOUNTER — Emergency Department (HOSPITAL_COMMUNITY)
Admission: EM | Admit: 2022-11-29 | Discharge: 2022-11-30 | Disposition: A | Discharge status: Home / Self Care | Payer: Self-pay | Attending: Emergency Medicine | Admitting: Emergency Medicine

## 2022-11-29 DIAGNOSIS — R600 Localized edema: Secondary | ICD-10-CM | POA: Insufficient documentation

## 2022-11-29 DIAGNOSIS — Z7982 Long term (current) use of aspirin: Secondary | ICD-10-CM | POA: Insufficient documentation

## 2022-11-29 DIAGNOSIS — M7989 Other specified soft tissue disorders: Secondary | ICD-10-CM

## 2022-11-29 DIAGNOSIS — I10 Essential (primary) hypertension: Secondary | ICD-10-CM | POA: Insufficient documentation

## 2022-11-29 DIAGNOSIS — Z79899 Other long term (current) drug therapy: Secondary | ICD-10-CM | POA: Insufficient documentation

## 2022-11-29 NOTE — ED Triage Notes (Addendum)
Pt reports concern for increased left leg swelling with discoloration/bruise that has been ongoing approx 2 weeks. Endorses pain to lower extremity. Ambulatory in triage. Unknown injury. No hx of DVT.

## 2022-11-29 NOTE — ED Provider Notes (Signed)
Kevin Sampson Provider Note   CSN: [id number] Arrival date & time: 11/29/22  2053     History {Add pertinent medical, surgical, social history, OB history to HPI:1} Chief Complaint  Patient presents with   Leg Swelling    L    Kevin Sampson is a 62 y.o. male.  HPI Patient presents for left leg swelling.  Medical history includes HTN.  He feels that he may have struck his left shin area but does not remember the injury.  He has had bruising and swelling to his distal left lower extremity over the past 2 weeks.  He is on his feet throughout the day for work.  He was using compression stockings which did help.  He is not on a blood thinner.  Patient presents to the ED at behest of his wife.    Home Medications Prior to Admission medications   Medication Sig Start Date End Date Taking? Authorizing Provider  amLODipine (NORVASC) 5 MG tablet Take 5 mg by mouth daily.    [provider]  aspirin EC 81 MG tablet Take 81 mg by mouth every evening.    [provider]  HYDROcodone-acetaminophen (NORCO/VICODIN) 5-325 MG per tablet Take 1 tablet by mouth every 4 (four) hours as needed. 08/23/13   Burgess Amor, PA-C  losartan-hydrochlorothiazide (HYZAAR) 100-25 MG per tablet Take 1 tablet by mouth daily.    [provider]  metFORMIN (GLUCOPHAGE-XR) 500 MG 24 hr tablet Take 500 mg by mouth daily with breakfast.    [provider]  naproxen (NAPROSYN) 500 MG tablet Take 1 tablet (500 mg total) by mouth 2 (two) times daily. 01/20/17   Rolland Porter, MD  simvastatin (ZOCOR) 10 MG tablet Take 10 mg by mouth at bedtime.    [provider]  sulfamethoxazole-trimethoprim (SEPTRA DS) 800-160 MG per tablet Take 1 tablet by mouth every 12 (twelve) hours. 08/23/13   Burgess Amor, PA-C      Allergies    Patient has no known allergies.    Review of Systems   Review of Systems  Cardiovascular:  Positive for leg swelling.  All  other systems reviewed and are negative.   Physical Exam Updated Vital Signs BP (!) 178/103 (BP Location: Right Arm)   Pulse (!) 102   Temp 98.4 F (36.9 C) (Oral)   Resp 15   Ht 5\' 7"  (1.702 m)   Wt 93 kg   SpO2 100%   BMI 32.11 kg/m  Physical Exam Vitals and nursing note reviewed.  Constitutional:      General: He is not in acute distress.    Appearance: Normal appearance. He is well-developed. He is not ill-appearing, toxic-appearing or diaphoretic.  HENT:     Head: Normocephalic and atraumatic.     Right Ear: External ear normal.     Left Ear: External ear normal.     Nose: Nose normal.     Mouth/Throat:     Mouth: Mucous membranes are moist.  Eyes:     Extraocular Movements: Extraocular movements intact.     Conjunctiva/sclera: Conjunctivae normal.  Cardiovascular:     Rate and Rhythm: Normal rate and regular rhythm.  Pulmonary:     Effort: Pulmonary effort is normal. No respiratory distress.  Abdominal:     General: There is no distension.     Palpations: Abdomen is soft.  Musculoskeletal:        General: Tenderness present. No swelling or deformity. Normal range  of motion.     Cervical back: Normal range of motion and neck supple.     Right lower leg: No edema.     Left lower leg: Edema present.  Skin:    General: Skin is warm and dry.     Findings: Bruising present.  Neurological:     General: No focal deficit present.     Mental Status: He is alert and oriented to person, place, and time.     Cranial Nerves: No cranial nerve deficit.     Sensory: No sensory deficit.     Motor: No weakness.     Coordination: Coordination normal.  Psychiatric:        Mood and Affect: Mood normal.        Behavior: Behavior normal.        Thought Content: Thought content normal.        Judgment: Judgment normal.     ED Results / Procedures / Treatments   Labs (all labs ordered are listed, but only abnormal results are displayed) Labs Reviewed - No data to  display  EKG None  Radiology No results found.  Procedures Procedures  {Document cardiac monitor, telemetry assessment procedure when appropriate:1}  Medications Ordered in ED Medications - No data to display  ED Course/ Medical Decision Making/ A&P   {   Click here for ABCD2, HEART and other calculatorsREFRESH Note before signing :1}                              Medical Decision Making  Patient presents to the ED for left leg swelling.  This has been present for the past 2 weeks.  On exam, he is overall well-appearing.  He has an area of bruising and tenderness to the medial aspect of his left shin.  He has swelling in his distal left lower extremity and foot.  Currently, DVT studies are not available due to time of night.  Patient was agreeable for lab work, including D-dimer.  This was ordered.***.  {Document critical care time when appropriate:1} {Document review of labs and clinical decision tools ie heart score, Chads2Vasc2 etc:1}  {Document your independent review of radiology images, and any outside records:1} {Document your discussion with family members, caretakers, and with consultants:1} {Document social determinants of health affecting pt's care:1} {Document your decision making why or why not admission, treatments were needed:1} Final Clinical Impression(s) / ED Diagnoses Final diagnoses:  None    Rx / DC Orders ED Discharge Orders     None

## 2022-11-30 LAB — CBC WITH DIFFERENTIAL/PLATELET
Abs Immature Granulocytes: 0.01 10*3/uL (ref 0.00–0.07)
Basophils Absolute: 0 10*3/uL (ref 0.0–0.1)
Basophils Relative: 0 %
Eosinophils Absolute: 0.3 10*3/uL (ref 0.0–0.5)
Eosinophils Relative: 4 %
HCT: 40.5 % (ref 39.0–52.0)
Hemoglobin: 14.2 g/dL (ref 13.0–17.0)
Immature Granulocytes: 0 %
Lymphocytes Relative: 47 %
Lymphs Abs: 3.9 10*3/uL (ref 0.7–4.0)
MCH: 29.2 pg (ref 26.0–34.0)
MCHC: 35.1 g/dL (ref 30.0–36.0)
MCV: 83.2 fL (ref 80.0–100.0)
Monocytes Absolute: 0.6 10*3/uL (ref 0.1–1.0)
Monocytes Relative: 7 %
Neutro Abs: 3.5 10*3/uL (ref 1.7–7.7)
Neutrophils Relative %: 42 %
Platelets: 275 10*3/uL (ref 150–400)
RBC: 4.87 MIL/uL (ref 4.22–5.81)
RDW: 13 % (ref 11.5–15.5)
WBC: 8.4 10*3/uL (ref 4.0–10.5)
nRBC: 0 % (ref 0.0–0.2)

## 2022-11-30 LAB — BASIC METABOLIC PANEL
Anion gap: 10 (ref 5–15)
BUN: 21 mg/dL — ABNORMAL HIGH (ref 6–20)
CO2: 25 mmol/L (ref 22–32)
Calcium: 9.4 mg/dL (ref 8.9–10.3)
Chloride: 103 mmol/L (ref 98–111)
Creatinine, Ser: 1.07 mg/dL (ref 0.61–1.24)
GFR, Estimated: >60 mL/min (ref >60)
Glucose, Bld: 171 mg/dL — ABNORMAL HIGH (ref 70–99)
Potassium: 3.5 mmol/L (ref 3.5–5.1)
Sodium: 138 mmol/L (ref 135–145)

## 2022-11-30 LAB — D-DIMER, QUANTITATIVE: D-Dimer, Quant: 0.53 ug{FEU}/mL — ABNORMAL HIGH (ref 0.00–0.50)

## 2022-11-30 NOTE — Discharge Instructions (Signed)
Lab work today is reassuring.  Wear compression stockings when standing throughout the day.  Elevate your left leg when possible.  Take ibuprofen and Tylenol as needed for pain.  Return to the emergency department for any new or worsening symptoms of concern.
